# Patient Record
Sex: Female | Born: 1985 | Race: White | Hispanic: No | Marital: Married | Smoking: Light tobacco smoker
Health system: Southern US, Community
[De-identification: ages and names within clinical notes are randomized; demographics above are authoritative.]

## PROBLEM LIST (undated history)

## (undated) DIAGNOSIS — K219 Gastro-esophageal reflux disease without esophagitis: Secondary | ICD-10-CM

## (undated) DIAGNOSIS — F32A Depression, unspecified: Secondary | ICD-10-CM

## (undated) DIAGNOSIS — F329 Major depressive disorder, single episode, unspecified: Secondary | ICD-10-CM

## (undated) DIAGNOSIS — F419 Anxiety disorder, unspecified: Secondary | ICD-10-CM

## (undated) HISTORY — DX: Major depressive disorder, single episode, unspecified: F32.9

## (undated) HISTORY — DX: Depression, unspecified: F32.A

## (undated) HISTORY — PX: TUBAL LIGATION: SHX77

## (undated) HISTORY — PX: CHOLECYSTECTOMY: SHX55

## (undated) HISTORY — DX: Gastro-esophageal reflux disease without esophagitis: K21.9

## (undated) HISTORY — DX: Anxiety disorder, unspecified: F41.9

---

## 2017-11-27 ENCOUNTER — Encounter: Payer: Self-pay | Admitting: Medical

## 2017-11-27 ENCOUNTER — Ambulatory Visit (HOSPITAL_BASED_OUTPATIENT_CLINIC_OR_DEPARTMENT_OTHER)
Admission: RE | Admit: 2017-11-27 | Discharge: 2017-11-27 | Disposition: A | Discharge status: Home / Self Care | Payer: Managed Care, Other (non HMO) | Source: Ambulatory Visit | Attending: Medical | Admitting: Medical

## 2017-11-27 ENCOUNTER — Ambulatory Visit: Payer: Managed Care, Other (non HMO) | Admitting: Medical

## 2017-11-27 VITALS — BP 123/80 | HR 70 | Temp 98.6°F | Resp 16 | Ht 63.5 in | Wt 196.4 lb

## 2017-11-27 DIAGNOSIS — R05 Cough: Secondary | ICD-10-CM | POA: Diagnosis present

## 2017-11-27 DIAGNOSIS — F17200 Nicotine dependence, unspecified, uncomplicated: Secondary | ICD-10-CM

## 2017-11-27 DIAGNOSIS — R0789 Other chest pain: Secondary | ICD-10-CM

## 2017-11-27 DIAGNOSIS — R062 Wheezing: Secondary | ICD-10-CM

## 2017-11-27 DIAGNOSIS — R059 Cough, unspecified: Secondary | ICD-10-CM

## 2017-11-27 DIAGNOSIS — F419 Anxiety disorder, unspecified: Secondary | ICD-10-CM

## 2017-11-27 DIAGNOSIS — F32A Depression, unspecified: Secondary | ICD-10-CM

## 2017-11-27 DIAGNOSIS — F329 Major depressive disorder, single episode, unspecified: Secondary | ICD-10-CM | POA: Diagnosis not present

## 2017-11-27 DIAGNOSIS — M940 Chondrocostal junction syndrome [Tietze]: Secondary | ICD-10-CM

## 2017-11-27 MED ORDER — BUPROPION HCL ER (XL) 150 MG PO TB24: 150.0000 mg | ORAL_TABLET | Freq: Every day | ORAL | 0 refills | Status: DC

## 2017-11-27 MED ORDER — PREDNISONE 10 MG PO TABS: ORAL_TABLET | ORAL | 0 refills | Status: DC

## 2017-11-27 MED ORDER — BUSPIRONE HCL 7.5 MG PO TABS: 7.5000 mg | ORAL_TABLET | Freq: Two times a day (BID) | ORAL | 0 refills | Status: DC

## 2017-11-27 NOTE — Progress Notes (Signed)
Subjective:    Patient ID: Karen Bradley, female    DOB: [birth year], 32 y.o.   MRN: [id number]  HPI  Pt in for first time.   Pt living in area for 4 years. From [place]. Pt exercises on regular basis. She walks her dog 2 miles a day. Pt eats healthy. Smoke 4 cigarettes. 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 7 and 34 yo.   Pt does have anxiety for about 3 months. She states started with panic attack when she had to go to ED. Was diagnosed with anxiety. Pt states she worries of her own health.Pt states this anxiety seemed to start after she missed a lot of warning signs from her sons appendicitis/rupture. Seemed to make her realize potential problems for health. No meds for anxiety. Also admits to mild depression.(No hx of seizures or eating disorder) Pt states mid morning she feels anxious the most.  Pt also mentions recent faint bubble sensation with anxiety sensation(also faint soreness around her chest for one week.)  States faint soreness in rib cage. Also feeling some wheezing intermittently. Pt smoker since 32 yo. Used to be 1/2 pack a day smoker. No hx of asthma when younger. UC gave benzonatate for occasional cough. Pt still has mild wheeze.   No history of heart burn. No obvious belch.  Pt had early menopause. 4 years ago  Review of Systems  Constitutional: Negative for chills, fatigue and fever.  HENT: Negative for congestion, ear discharge and ear pain.   Respiratory: Negative for cough, chest tightness and wheezing.   Cardiovascular: Negative for chest pain and palpitations.       See hpi.  Gastrointestinal: Negative for abdominal pain.  Genitourinary: Negative for flank pain.  Musculoskeletal: Negative for back pain, joint swelling and neck stiffness.       See hpi  Skin: Negative for rash.  Neurological: Negative for dizziness, speech difficulty, weakness, light-headedness and headaches.  Hematological: Negative for adenopathy. Does not bruise/bleed easily.    Psychiatric/Behavioral: Positive for dysphoric mood. Negative for behavioral problems, confusion, sleep disturbance and suicidal ideas. The patient is nervous/anxious.      Past Medical History:  Diagnosis Date  . Anxiety   . Depression   . GERD (gastroesophageal reflux disease)      Social History   Socioeconomic History  . Marital status: Married    Spouse name: Not on file  . Number of children: Not on file  . Years of education: Not on file  . Highest education level: Not on file  Occupational History  . Not on file  Social Needs  . Financial resource strain: Not on file  . Food insecurity:    Worry: Not on file    Inability: Not on file  . Transportation needs:    Medical: Not on file    Non-medical: Not on file  Tobacco Use  . Smoking status: Light Tobacco Smoker  . Smokeless tobacco: Never Used  Substance and Sexual Activity  . Alcohol use: Never    Frequency: Never  . Drug use: Not on file  . Sexual activity: Not on file  Lifestyle  . Physical activity:    Days per week: Not on file    Minutes per session: Not on file  . Stress: Not on file  Relationships  . Social connections:    Talks on phone: Not on file    Gets together: Not on file    Attends religious service: Not on file    Active  member of club or organization: Not on file    Attends meetings of clubs or organizations: Not on file    Relationship status: Not on file  . Intimate partner violence:    Fear of current or ex partner: Not on file    Emotionally abused: Not on file    Physically abused: Not on file    Forced sexual activity: Not on file  Other Topics Concern  . Not on file  Social History Narrative  . Not on file      Family History  Problem Relation Age of Onset  . Depression Mother   . Diabetes Mother   . Hyperlipidemia Mother   . Heart disease Mother   . Diabetes Father   . Heart disease Father   . Hyperlipidemia Father   . Depression Brother   . Diabetes Brother    . Heart disease Brother   . Hyperlipidemia Brother   . Mental illness Brother     Not on File  Current Outpatient Medications on File Prior to Visit  Medication Sig Dispense Refill  . benzonatate (TESSALON) 200 MG capsule TK ONE C PO TID  0  . PROAIR HFA 108 (90 Base) MCG/ACT inhaler INL 2 PFS PO Q 4 TO 6 H PRN  0   No current facility-administered medications on file prior to visit.     BP 123/80   Pulse 70   Temp 98.6 F (37 C) (Oral)   Resp 16   Ht 5' 3.5" (1.613 m)   Wt 196 lb 6.4 oz (89.1 kg)   LMP 11/23/2017   SpO2 98%   BMI 34.24 kg/m       Objective:   Physical Exam  General Mental Status- Alert. General Appearance- Not in acute distress.   Skin General: Color- Normal Color. Moisture- Normal Moisture.  Neck Carotid Arteries- Normal color. Moisture- Normal Moisture. No carotid bruits. No JVD.  Chest and Lung Exam Auscultation: Breath Sounds:-Normal.  Cardiovascular Auscultation:Rythm- Regular. Murmurs & Other Heart Sounds:Auscultation of the heart reveals- No Murmurs.  Abdomen Inspection:-Inspeection Normal. Palpation/Percussion:Note:No mass. Palpation and Percussion of the abdomen reveal- Non Tender, Non Distended + BS, no rebound or guarding.  Neurologic Cranial Nerve exam:- CN III-XII intact(No nystagmus), symmetric smile. Strength:- 5/5 equal and symmetric strength both upper and lower extremities.  Anterior thorax- costochondral junction tender to palpation and left rib tender to palption.      Assessment & Plan:  For anxiety and depression, I am prescribing BuSpar and Wellbutrin.  Recommend BuSpar first and then in 1 week start Wellbutrin.  BuSpar is more for more for anxiety and Wellbutrin for your mood.  Also Wellbutrin help with smoking cessation.  I recommend start to taper off of cigarettes after being on Wellbutrin for 2 weeks.  You do appear to have some costochondritis type symptoms recently.  Physical exam supports  costochondritis diagnosis.  Will prescribe prednisone a brief taper dose to see if this helps.  Also prednisone can help with recent mild wheezing.  Continue albuterol if needed.  I decided best to go ahead and get a baseline EKG to have on hand in the event any recurrent atypical chest pain reoccurs.  Also please get chest x-ray today.  Follow-up in 3 to 4 weeks or as needed.  Mackie Pai, PA-C

## 2017-11-27 NOTE — Patient Instructions (Addendum)
For anxiety and depression, I am prescribing BuSpar and Wellbutrin.  Recommend BuSpar first and then in 1 week start Wellbutrin.  BuSpar is more for more for anxiety and Wellbutrin for your mood.  Also Wellbutrin help with smoking cessation.  I recommend start to taper off of cigarettes after being on Wellbutrin for 2 weeks.  You do appear to have some costochondritis type symptoms recently.  Physical exam supports costochondritis diagnosis.  Will prescribe prednisone a brief taper dose to see if this helps.  Also prednisone can help with recent mild wheezing.  Continue albuterol if needed.  I decided best to go ahead and get a baseline EKG to have on hand in the event any recurrent atypical chest pain reoccurs.(sinus rhythm. Mild bradycardia)  Also please get chest x-ray today.  Follow-up in 3 to 4 weeks or as needed.

## 2017-11-28 ENCOUNTER — Other Ambulatory Visit: Payer: Self-pay

## 2017-11-28 ENCOUNTER — Encounter (HOSPITAL_BASED_OUTPATIENT_CLINIC_OR_DEPARTMENT_OTHER): Payer: Self-pay

## 2017-11-28 ENCOUNTER — Emergency Department (HOSPITAL_BASED_OUTPATIENT_CLINIC_OR_DEPARTMENT_OTHER): Payer: Managed Care, Other (non HMO)

## 2017-11-28 ENCOUNTER — Ambulatory Visit: Payer: Self-pay

## 2017-11-28 ENCOUNTER — Emergency Department (HOSPITAL_BASED_OUTPATIENT_CLINIC_OR_DEPARTMENT_OTHER)
Admission: EM | Admit: 2017-11-28 | Discharge: 2017-11-28 | Disposition: A | Discharge status: Home / Self Care | Payer: Managed Care, Other (non HMO) | Attending: Emergency Medicine | Admitting: Emergency Medicine

## 2017-11-28 DIAGNOSIS — Z79899 Other long term (current) drug therapy: Secondary | ICD-10-CM | POA: Insufficient documentation

## 2017-11-28 DIAGNOSIS — F17200 Nicotine dependence, unspecified, uncomplicated: Secondary | ICD-10-CM | POA: Insufficient documentation

## 2017-11-28 DIAGNOSIS — R079 Chest pain, unspecified: Secondary | ICD-10-CM | POA: Diagnosis present

## 2017-11-28 DIAGNOSIS — D3501 Benign neoplasm of right adrenal gland: Secondary | ICD-10-CM | POA: Insufficient documentation

## 2017-11-28 DIAGNOSIS — R0789 Other chest pain: Secondary | ICD-10-CM | POA: Insufficient documentation

## 2017-11-28 LAB — TROPONIN I: Troponin I: <0.03 ng/mL (ref <0.03)

## 2017-11-28 LAB — CBC
HEMATOCRIT: 47.7 % — AB (ref 36.0–46.0)
Hemoglobin: 16.7 g/dL — ABNORMAL HIGH (ref 12.0–15.0)
MCH: 31.8 pg (ref 26.0–34.0)
MCHC: 35 g/dL (ref 30.0–36.0)
MCV: 90.9 fL (ref 80.0–100.0)
NRBC: 0 % (ref 0.0–0.2)
Platelets: 359 10*3/uL (ref 150–400)
RBC: 5.25 MIL/uL — ABNORMAL HIGH (ref 3.87–5.11)
RDW: 11.6 % (ref 11.5–15.5)
WBC: 26.3 10*3/uL — AB (ref 4.0–10.5)

## 2017-11-28 LAB — BASIC METABOLIC PANEL
Anion gap: 11 (ref 5–15)
BUN: 10 mg/dL (ref 6–20)
CHLORIDE: 102 mmol/L (ref 98–111)
CO2: 24 mmol/L (ref 22–32)
CREATININE: 0.62 mg/dL (ref 0.44–1.00)
Calcium: 9.6 mg/dL (ref 8.9–10.3)
GFR calc non Af Amer: >60 mL/min (ref >60)
Glucose, Bld: 110 mg/dL — ABNORMAL HIGH (ref 70–99)
Potassium: 3.5 mmol/L (ref 3.5–5.1)
SODIUM: 137 mmol/L (ref 135–145)

## 2017-11-28 LAB — PREGNANCY, URINE: PREG TEST UR: NEGATIVE

## 2017-11-28 MED ORDER — IOPAMIDOL (ISOVUE-370) INJECTION 76%
100.0000 mL | Freq: Once | INTRAVENOUS | Status: AC | PRN
  Administered 2017-11-28: 72 mL via INTRAVENOUS

## 2017-11-28 NOTE — ED Triage Notes (Signed)
Pt c/o central CP x today-states she was seen by PCP yesterday for same-dx costochondritis and anxiety-rx prednisone, buspar and to start wellbutrin in  1week-pt anxious/ NAD-steady gait

## 2017-11-28 NOTE — ED Notes (Signed)
ED Provider at bedside. 

## 2017-11-28 NOTE — Discharge Instructions (Addendum)
Discontinue the prednisone. Follow-up with your primary care provider.  Return to ER for worsening or concerning symptoms.

## 2017-11-28 NOTE — ED Provider Notes (Addendum)
Medical screening examination/treatment/procedure(s) were conducted as a shared visit with non-physician practitioner(s) and myself.  I personally evaluated the patient during the encounter.  EKG Interpretation  Date/Time:  Friday November 28 2017 16:25:13 EDT Ventricular Rate:  80 PR Interval:  134 QRS Duration: 82 QT Interval:  344 QTC Calculation: 396 R Axis:   75 Text Interpretation:  Normal sinus rhythm ST & T wave abnormality, consider inferior ischemia No previous tracing Confirmed by Blanchie Dessert [phone]) on 12/03/2017 8:46:18 PM  Patient has had several episodes of recurrent chest pain that have been attributed to anxiety.  Patient reports that she does not feel anxious but when she started getting chest pain she got diagnosed with anxiety.  Pain radiates with associated shortness of breath.  Patient is alert and nontoxic.  Lungs are grossly clear.  Abdomen soft and nontender.  CBC is significantly elevated.  At this time chest pain is atypical but concerning for possible PE with shortness of breath and radiating quality, or other structural/ infectious etiology.  Recommend proceeding with CTA of the chest.  I agree with plan of management.   Charlesetta Shanks, MD 11/28/17 Junious Dresser    Charlesetta Shanks, MD 12/10/17 [id number]    Charlesetta Shanks, MD 12/10/17 [phone]

## 2017-11-28 NOTE — ED Provider Notes (Signed)
[facility] EMERGENCY DEPARTMENT Provider Note   CSN: [id number] Arrival date & time: 11/28/17  1616     History   Chief Complaint Chief Complaint  Patient presents with  . Chest Pain    HPI Karen Bradley is a 32 y.o. female.  32 year old female presents with complaint of pain in her chest.  Started around 11 AM this morning about 30 minutes after taking prednisone and BuSpar today.  Pain is constant, worse with palpation, located at her lower sternum and adjacent ribs.  Reports having her normal anxiety symptoms described as pain in her left and right axillary areas that radiates around to her back, with mild shortness of breath.  Patient was seen at her PCP office yesterday to establish care and seek treatment for anxiety.  Patient was diagnosed with anxiety several months ago, states she has been having daily anxiety for the past 2 weeks, went to PCP who put her on BuSpar and gave her prednisone for diagnosis of costochondritis (costochondritis this is made based on history of pain in her axillary areas with report of wheezing and cough).     Past Medical History:  Diagnosis Date  . Anxiety   . Depression   . GERD (gastroesophageal reflux disease)     There are no active problems to display for this patient.   Past Surgical History:  Procedure Laterality Date  . CHOLECYSTECTOMY    . TUBAL LIGATION       OB History   None      Home Medications    Prior to Admission medications   Medication Sig Start Date End Date Taking? Authorizing Provider  benzonatate (TESSALON) 200 MG capsule TK ONE C PO TID 11/21/17   [provider]  buPROPion (WELLBUTRIN XL) 150 MG 24 hr tablet Take 1 tablet (150 mg total) by mouth daily. 11/27/17   Saguier, Percell Miller, PA-C  busPIRone (BUSPAR) 7.5 MG tablet Take 1 tablet (7.5 mg total) by mouth 2 (two) times daily. 11/27/17   Saguier, Percell Miller, PA-C  predniSONE (DELTASONE) 10 MG tablet 4 tab po day 1, 3 tab po day 2, 2  tab po day 3, 1 tab po day 4 11/27/17   Saguier, Percell Miller, PA-C  PROAIR HFA 108 (90 Base) MCG/ACT inhaler INL 2 PFS PO Q 4 TO 6 H PRN 11/21/17   [provider]    Family History Family History  Problem Relation Age of Onset  . Depression Mother   . Diabetes Mother   . Hyperlipidemia Mother   . Heart disease Mother   . Diabetes Father   . Heart disease Father   . Hyperlipidemia Father   . Depression Brother   . Diabetes Brother   . Heart disease Brother   . Hyperlipidemia Brother   . Mental illness Brother     Social History Social History   Tobacco Use  . Smoking status: Light Tobacco Smoker    Packs/day: 0.25  . Smokeless tobacco: Never Used  Substance Use Topics  . Alcohol use: Never    Frequency: Never  . Drug use: Never     Allergies   Patient has no known allergies.   Review of Systems Review of Systems  Constitutional: Negative for chills and fever.  HENT: Negative for congestion.   Respiratory: Positive for cough, shortness of breath and wheezing. Negative for chest tightness.   Cardiovascular: Positive for chest pain. Negative for palpitations and leg swelling.  Gastrointestinal: Positive for nausea. Negative for abdominal pain, constipation,  diarrhea and vomiting.  Genitourinary: Negative for dysuria and frequency.  Musculoskeletal: Negative for arthralgias and neck pain.  Skin: Negative for rash and wound.  Allergic/Immunologic: Negative for immunocompromised state.  Hematological: Negative for adenopathy. Does not bruise/bleed easily.  Psychiatric/Behavioral: Negative for confusion.  All other systems reviewed and are negative.    Physical Exam Updated Vital Signs BP 119/84 (BP Location: Right Arm)   Pulse 76   Temp 98.2 F (36.8 C) (Oral)   Resp 16   Ht 5\' 3"  (1.6 m)   Wt 89.1 kg   LMP 11/23/2017   SpO2 100%   BMI 34.80 kg/m   Physical Exam  Constitutional: She is oriented to person, place, and time. She appears well-developed  and well-nourished. No distress.  HENT:  Head: Normocephalic and atraumatic.  Cardiovascular: Normal rate, regular rhythm, intact distal pulses and normal pulses.  Pulmonary/Chest: Effort normal and breath sounds normal. She has no wheezes.    Abdominal: Soft. There is no tenderness.  Musculoskeletal:       Right lower leg: She exhibits no tenderness and no edema.       Left lower leg: She exhibits no tenderness and no edema.  Neurological: She is alert and oriented to person, place, and time.  Skin: Skin is warm and dry. She is not diaphoretic.  Psychiatric: She has a normal mood and affect. Her behavior is normal.  Nursing note and vitals reviewed.    ED Treatments / Results  Labs (all labs ordered are listed, but only abnormal results are displayed) Labs Reviewed  BASIC METABOLIC PANEL - Abnormal; Notable for the following components:      Result Value   Glucose, Bld 110 (*)    All other components within normal limits  CBC - Abnormal; Notable for the following components:   WBC 26.3 (*)    RBC 5.25 (*)    Hemoglobin 16.7 (*)    HCT 47.7 (*)    All other components within normal limits  TROPONIN I  PREGNANCY, URINE    EKG None  Radiology Dg Chest 2 View  Result Date: 11/28/2017 CLINICAL DATA:  Chest pain EXAM: CHEST - 2 VIEW COMPARISON:  11/27/2017 FINDINGS: The heart size and mediastinal contours are within normal limits. Both lungs are clear. The visualized skeletal structures are unremarkable. IMPRESSION: Normal chest. Electronically Signed   By: Ulyses Jarred M.D.   On: 11/28/2017 17:16   Dg Chest 2 View  Result Date: 11/27/2017 CLINICAL DATA:  Cough EXAM: CHEST - 2 VIEW COMPARISON:  None. FINDINGS: Lungs are clear. Heart size and pulmonary vascularity are normal. No adenopathy. No pneumothorax. No bone lesions. IMPRESSION: No edema or consolidation. Electronically Signed   By: Lowella Grip III M.D.   On: 11/27/2017 11:03   Ct Angio Chest Pe W/cm &/or Wo  Cm  Result Date: 11/28/2017 CLINICAL DATA:  32 y/o  F; 1 day of shortness of breath. EXAM: CT ANGIOGRAPHY CHEST WITH CONTRAST TECHNIQUE: Multidetector CT imaging of the chest was performed using the standard protocol during bolus administration of intravenous contrast. Multiplanar CT image reconstructions and MIPs were obtained to evaluate the vascular anatomy. CONTRAST:  36mL ISOVUE-370 IOPAMIDOL (ISOVUE-370) INJECTION 76% COMPARISON:  11/28/2017 chest radiograph FINDINGS: Cardiovascular: Satisfactory opacification of the pulmonary arteries to the segmental level. No evidence of pulmonary embolism. Normal heart size. No pericardial effusion. Mediastinum/Nodes: No enlarged mediastinal, hilar, or axillary lymph nodes. Thyroid gland, trachea, and esophagus demonstrate no significant findings. Lungs/Pleura: Lungs are clear. No pleural  effusion or pneumothorax. Upper Abdomen: Cholecystectomy. 15 mm right adrenal nodule measuring 3 HU compatible with adenoma. Musculoskeletal: No chest wall abnormality. No acute or significant osseous findings. Review of the MIP images confirms the above findings. IMPRESSION: 1. No pulmonary embolus identified.  Clear lungs. 2. 15 mm right adrenal adenoma. Electronically Signed   By: Kristine Garbe M.D.   On: 11/28/2017 19:29    Procedures Procedures (including critical care time)  Medications Ordered in ED Medications  iopamidol (ISOVUE-370) 76 % injection 100 mL (72 mLs Intravenous Contrast Given 11/28/17 1904)     Initial Impression / Assessment and Plan / ED Course  I have reviewed the triage vital signs and the nursing notes.  Pertinent labs & imaging results that were available during my care of the patient were reviewed by me and considered in my medical decision making (see chart for details).  Clinical Course as of Nov 29 1939  Fri Oct 25, 935  [id number] 32 year old female presents with complaint of chest pain today after taking her medication.   Patient states that she was diagnosed with anxiety 2 months ago, followed up with PCP yesterday for worsening anxiety symptoms for the past 2 weeks.  Patient describes her anxiety as pain in her mid axillary lower ribs which radiates into her back and causes her to feel nauseous with shortness of breath.  Chest x-ray is normal, repeat vital signs within normal limits, CBC with elevated white count 26,000.  Troponin x1-, BMP unremarkable, urine pregnancy test is negative.  Case discussed with Dr. Vallery Ridge, ER attending, has seen the patient and recommends CTA chest to evaluate for PE.   [LM]  1904 Personal family history of PE, patient is a daily smoker, does not take hormonal placement therapy, no recent extended travel.   [LM]  1940 CTA is negative for PE, incidental finding of right adrenal adenoma.  Findings discussed with patient, recommend PCP follow-up return to ER for worsening or concerning symptoms.  Recommend that she discontinue the prednisone.   [LM]    Clinical Course User Index [LM] Tacy Learn, PA-C   Final Clinical Impressions(s) / ED Diagnoses   Final diagnoses:  Atypical chest pain  Adrenal adenoma, right    ED Discharge Orders    None       Tacy Learn, PA-C 11/28/17 1941    Charlesetta Shanks, MD 12/10/17 1610

## 2017-11-28 NOTE — ED Notes (Signed)
Ambulated to RR unassisted

## 2017-11-28 NOTE — Telephone Encounter (Signed)
Returned call to pt.  Reported she had onset of "a burning sensation" just below her sternum, after awakening today.  Reported the pain has been constant the past 2 hrs.  Rated the pain at 7/10.  Stated there is a radiation of the pain from the base of the sternum to her throat. Reported she tried Tums, without any relief.  Reported feels the need to burp intermittently.  Seen in office yesterday, and started Prednisone and Buspar.  Reported took Prednisone 4 tabs 10/24, and 2 tabs today.  Reported took Buspar 2 tabs 10/24, and 1 tab today.  Has only eaten a dry piece of toast today.  Denied having anything like this before.  Reported she has not smoked a cigarette today.  Denied nausea/ vomiting, or dizziness.  Stated she has intermittent shortness of breath because her "throat burns."    Called FC.  Reported pt's. complaints.  Advised to send Triage note to the office for provider to review and make further recommendations.    Pt. advised of plan and to await call from PCP office for recommendations.  Advised if has worsening chest pain or shortness of breath, to go to ER or call 911.  Verb. Understanding and agreed.         Reason for Disposition . [1] MILD-MODERATE pain AND [2] constant AND [3] present > 2 hours    Seen in the office yesterday for anxiety and panic attacks.  Diag. with Costrochondritis and started on Prednisone and Buspar.  Call placed to PCP office to report c/o severe burning from base of sternum to the throat.  Hx of smoking.  Answer Assessment - Initial Assessment Questions 1. LOCATION: "Where does it hurt?"       Just below the sternum  2. RADIATION: "Does the pain go anywhere else?" (e.g., into neck, jaw, arms, back)     Radiates up to chest to the throat like it is burning 3. ONSET: "When did the chest pain begin?" (Minutes, hours or days)      When she was was up for a little while  4. PATTERN "Does the pain come and go, or has it been constant since it started?"   "Does it get worse with exertion?"      Constant x 2 hrs.  5. DURATION: "How long does it last" (e.g., seconds, minutes, hours)     Ongoing  6. SEVERITY: "How bad is the pain?"  (e.g., Scale 1-10; mild, moderate, or severe)    - MILD (1-3): doesn't interfere with normal activities     - MODERATE (4-7): interferes with normal activities or awakens from sleep    - SEVERE (8-10): excruciating pain, unable to do any normal activities       7/10 7. CARDIAC RISK FACTORS: "Do you have any history of heart problems or risk factors for heart disease?" (e.g., prior heart attack, angina; high blood pressure, diabetes, being overweight, high cholesterol, smoking, or strong family history of heart disease)     Denied HBP, elev. Cholesterol, or DM; trying to quit smoking; has not had a cigarette today; is not currently taking any thing for smoking cessation 8. PULMONARY RISK FACTORS: "Do you have any history of lung disease?"  (e.g., blood clots in lung, asthma, emphysema, birth control pills)    Did not ask 9. CAUSE: "What do you think is causing the chest pain?"    Unknown; hx of anxiety 10. OTHER SYMPTOMS: "Do you have any other symptoms?" (e.g., dizziness, nausea,  vomiting, sweating, fever, difficulty breathing, cough)       Heartburn,  Just "burns",  Pain is constant from upper stomach to the throat, intermittent shortness of breath, denied dizziness  11. PREGNANCY: "Is there any chance you are pregnant?" "When was your last menstrual period?"       LMP ; ending today  Answer Assessment - Initial Assessment Questions 1. LOCATION: "Where does it hurt?"      At base of sternum 2. RADIATION: "Does the pain shoot anywhere else?" (e.g., chest, back)     From base of sternum to the throat; feels like throat is burning; feels need to burp 3. ONSET: "When did the pain begin?" (e.g., minutes, hours or days ago)      Today, a little while after getting up; has been constant past 2 hrs.  4. SUDDEN: "Gradual  or sudden onset?"     gradual 5. PATTERN "Does the pain come and go, or is it constant?"    - If constant: "Is it getting better, staying the same, or worsening?"      (Note: Constant means the pain never goes away completely; most serious pain is constant and it progresses)     - If intermittent: "How long does it last?" "Do you have pain now?"     (Note: Intermittent means the pain goes away completely between bouts)     Constant past 2 hrs. 6. SEVERITY: "How bad is the pain?"  (e.g., Scale 1-10; mild, moderate, or severe)    - MILD (1-3): doesn't interfere with normal activities, abdomen soft and not tender to touch     - MODERATE (4-7): interferes with normal activities or awakens from sleep, tender to touch     - SEVERE (8-10): excruciating pain, doubled over, unable to do any normal activities       7/10; describes like a burning sensation  7. RECURRENT SYMPTOM: "Have you ever had this type of abdominal pain before?" If so, ask: "When was the last time?" and "What happened that time?"      no 8. AGGRAVATING FACTORS: "Does anything seem to cause this pain?" (e.g., foods, stress, alcohol)     Has anxiety 9. CARDIAC SYMPTOMS: "Do you have any of the following symptoms: chest pain, difficulty breathing, sweating, nausea?"     Denied nausea/ vomiting.  Reported intermittent shortness of breath with throat burning  10. OTHER SYMPTOMS: "Do you have any other symptoms?" (e.g., fever, vomiting, diarrhea)       Denied dizziness 11. PREGNANCY: "Is there any chance you are pregnant?" "When was your last menstrual period?"       LMP; finishing this today.  Protocols used: ABDOMINAL PAIN - UPPER-A-AH, CHEST PAIN-A-AH Message from Gardiner Ramus sent at 11/28/2017 2:04 PM EDT   Summary: heartburn    Pt called and stated that she is experiencing server heartburn. Pt would like to know what she could take that would not affect current medications.

## 2017-11-30 NOTE — Telephone Encounter (Signed)
Please ask patient to follow up from the emergency department visit. This is needed post ED visit rather than phone call.

## 2017-12-01 NOTE — Telephone Encounter (Signed)
Please call and schedule pt follow up.

## 2017-12-01 NOTE — Telephone Encounter (Signed)
Pt scheduled appt for ED this Thursday at 10:00 am. Done

## 2017-12-04 ENCOUNTER — Encounter: Payer: Self-pay | Admitting: Medical

## 2017-12-04 ENCOUNTER — Ambulatory Visit: Payer: Managed Care, Other (non HMO) | Admitting: Medical

## 2017-12-04 VITALS — BP 121/81 | HR 77 | Temp 98.9°F | Resp 16 | Ht 64.0 in | Wt 196.6 lb

## 2017-12-04 DIAGNOSIS — F419 Anxiety disorder, unspecified: Secondary | ICD-10-CM

## 2017-12-04 DIAGNOSIS — R062 Wheezing: Secondary | ICD-10-CM

## 2017-12-04 DIAGNOSIS — F32A Depression, unspecified: Secondary | ICD-10-CM

## 2017-12-04 DIAGNOSIS — D3501 Benign neoplasm of right adrenal gland: Secondary | ICD-10-CM

## 2017-12-04 DIAGNOSIS — F329 Major depressive disorder, single episode, unspecified: Secondary | ICD-10-CM

## 2017-12-04 DIAGNOSIS — M940 Chondrocostal junction syndrome [Tietze]: Secondary | ICD-10-CM | POA: Diagnosis not present

## 2017-12-04 DIAGNOSIS — D3500 Benign neoplasm of unspecified adrenal gland: Secondary | ICD-10-CM

## 2017-12-04 DIAGNOSIS — D72829 Elevated white blood cell count, unspecified: Secondary | ICD-10-CM

## 2017-12-04 MED ORDER — FLUTICASONE PROPIONATE HFA 110 MCG/ACT IN AERO: 2.0000 | INHALATION_SPRAY | Freq: Two times a day (BID) | RESPIRATORY_TRACT | 0 refills | Status: DC

## 2017-12-04 MED ORDER — CLONAZEPAM 0.5 MG PO TABS: 0.5000 mg | ORAL_TABLET | Freq: Two times a day (BID) | ORAL | 0 refills | Status: DC | PRN

## 2017-12-04 NOTE — Progress Notes (Signed)
Subjective:    Patient ID: Karen Bradley, female    DOB: 21-Sep-1985, 32 y.o.   MRN: [id number]  HPI   Pt in for follow up.  She still feels bubble in mid chest and has some possible faint wheeze. Pt went to ED and had extensive work up. Pt troponin and ekg were negative. Pt ct of chest showed no PE.  After negative work up ED attributed all symptoms to anxiety.   Pt explains mid chest and describes cartlidge junction tender to palpation.  Pt mentions on prednisone in past had no reaction before. But she thinks with buspar it made her more anxious.  She has been on buspar after ED visit but no side effects However it is not helping her anxiety. Pt is willing to use clonzepam in light of no response to buspar.   Pt did have high wbc count. She states it was always been high. Even as child. Pt took one day of prednisone before she went to the ED.   Pt is in early menopause. She has not been running fever but does get some hot flashes.    Review of Systems  Constitutional: Negative for chills, fatigue and fever.  Respiratory: Negative for cough, choking and wheezing.   Cardiovascular: Negative for chest pain and palpitations.       Non cardiac chest pain  Gastrointestinal: Negative for abdominal pain.  Musculoskeletal: Negative for back pain.       See hpi.  Skin: Negative for rash.  Neurological: Negative for dizziness, syncope, weakness and headaches.  Hematological: Negative for adenopathy. Does not bruise/bleed easily.  Psychiatric/Behavioral: Positive for dysphoric mood. Negative for agitation, behavioral problems, sleep disturbance and suicidal ideas. The patient is nervous/anxious.     Past Medical History:  Diagnosis Date  . Anxiety   . Depression   . GERD (gastroesophageal reflux disease)      Social History   Socioeconomic History  . Marital status: Married    Spouse name: Not on file  . Number of children: Not on file  . Years of education: Not on file  .  Highest education level: Not on file  Occupational History  . Not on file  Social Needs  . Financial resource strain: Not on file  . Food insecurity:    Worry: Not on file    Inability: Not on file  . Transportation needs:    Medical: Not on file    Non-medical: Not on file  Tobacco Use  . Smoking status: Light Tobacco Smoker    Packs/day: 0.25  . Smokeless tobacco: Never Used  Substance and Sexual Activity  . Alcohol use: Never    Frequency: Never  . Drug use: Never  . Sexual activity: Yes  Lifestyle  . Physical activity:    Days per week: Not on file    Minutes per session: Not on file  . Stress: Not on file  Relationships  . Social connections:    Talks on phone: Not on file    Gets together: Not on file    Attends religious service: Not on file    Active member of club or organization: Not on file    Attends meetings of clubs or organizations: Not on file    Relationship status: Not on file  . Intimate partner violence:    Fear of current or ex partner: Not on file    Emotionally abused: Not on file    Physically abused: Not on file  Forced sexual activity: Not on file  Other Topics Concern  . Not on file  Social History Narrative  . Not on file    Past Surgical History:  Procedure Laterality Date  . CHOLECYSTECTOMY    . TUBAL LIGATION      Family History  Problem Relation Age of Onset  . Depression Mother   . Diabetes Mother   . Hyperlipidemia Mother   . Heart disease Mother   . Diabetes Father   . Heart disease Father   . Hyperlipidemia Father   . Depression Brother   . Diabetes Brother   . Heart disease Brother   . Hyperlipidemia Brother   . Mental illness Brother     No Known Allergies  Current Outpatient Medications on File Prior to Visit  Medication Sig Dispense Refill  . benzonatate (TESSALON) 200 MG capsule TK ONE C PO TID  0  . buPROPion (WELLBUTRIN XL) 150 MG 24 hr tablet Take 1 tablet (150 mg total) by mouth daily. 30 tablet 0    . busPIRone (BUSPAR) 7.5 MG tablet Take 1 tablet (7.5 mg total) by mouth 2 (two) times daily. 60 tablet 0  . predniSONE (DELTASONE) 10 MG tablet 4 tab po day 1, 3 tab po day 2, 2 tab po day 3, 1 tab po day 4 10 tablet 0  . PROAIR HFA 108 (90 Base) MCG/ACT inhaler INL 2 PFS PO Q 4 TO 6 H PRN  0   No current facility-administered medications on file prior to visit.     BP 121/81   Pulse 77   Temp 98.9 F (37.2 C) (Oral)   Resp 16   Ht 5\' 4"  (1.626 m)   Wt 196 lb 9.6 oz (89.2 kg)   LMP 11/23/2017   SpO2 98%   BMI 33.75 kg/m       Objective:   Physical Exam  General Mental Status- Alert. General Appearance- Not in acute distress.   Skin General: Color- Normal Color. Moisture- Normal Moisture.  Neck Carotid Arteries- Normal color. Moisture- Normal Moisture. No carotid bruits. No JVD.  Chest and Lung Exam Auscultation: Breath Sounds:-Normal.  Cardiovascular Auscultation:Rythm- Regular. Murmurs & Other Heart Sounds:Auscultation of the heart reveals- No Murmurs.  Abdomen Inspection:-Inspeection Normal. Palpation/Percussion:Note:No mass. Palpation and Percussion of the abdomen reveal- Non Tender, Non Distended + BS, no rebound or guarding.   Neurologic Cranial Nerve exam:- CN III-XII intact(No nystagmus), symmetric smile. Strength:- 5/5 equal and symmetric strength both upper and lower extremities.  Anterior chest- mid chest and costochondral junction tender to palpation.    Assessment & Plan:  You had recent severe anxiety/almost panic attack event and were worked up in the emergency department for atypical chest pain.  Work-up for cardiac cause and pulmonary embolism were both negative.  Unfortunately you have not had decrease in anxiety with BuSpar.  So I did go ahead and send in clonazepam.  Rx advisement given and explained to use clonazepam sparingly.  You may need to put you on controlled medication contract in the near future if clonazepam helps and we determined  that you needed on a monthly basis.  For smoking cessation and mild depression, start Wellbutrin tomorrow.  You do appear to have some costochondritis on exam today.  I would recommend low-dose ibuprofen and I stop the prednisone.  For minimal intermittent wheezing, you have albuterol to use as needed.  If using albuterol on a repetitive every 6 hour basis then add on Flovent.  You have a  history of leukocytosis/increased WBCs.  This might be worsened by your smoking and recent prednisone use.  When you are in for follow-up will repeat WBC.  Depending on the level of might refer you to Dr. Marin Olp hematologist for further evaluation.  Incidental adrenal adenoma found on CT imaging done today.  Probably benign but will refer to endocrinologist for further work-up/evaluation.  Follow-up in 2 to 3 weeks or as needed.  Mackie Pai, PA-C

## 2017-12-04 NOTE — Patient Instructions (Signed)
You had recent severe anxiety/almost panic attack event and were worked up in the emergency department for atypical chest pain.  Work-up for cardiac cause and pulmonary embolism were both negative.  Unfortunately you have not had decrease in anxiety with BuSpar.  So I did go ahead and send in clonazepam.  Rx advisement given and explained to use clonazepam sparingly.  You may need to put you on controlled medication contract in the near future if clonazepam helps and we determined that you needed on a monthly basis.  For smoking cessation and mild depression, start Wellbutrin tomorrow.  You do appear to have some costochondritis on exam today.  I would recommend low-dose ibuprofen and I stop the prednisone.  For minimal intermittent wheezing, you have albuterol to use as needed.  If using albuterol on a repetitive every 6 hour basis then add on Flovent.  You have a history of leukocytosis/increased WBCs.  This might be worsened by your smoking and recent prednisone use.  When you are in for follow-up will repeat WBC.  Depending on the level of might refer you to Dr. Marin Olp hematologist for further evaluation.  Incidental adrenal adenoma found on CT imaging done today.  Probably benign but will refer to endocrinologist for further work-up/evaluation.  Follow-up in 2 to 3 weeks or as needed.

## 2017-12-23 ENCOUNTER — Encounter: Payer: Self-pay | Admitting: Medical

## 2017-12-23 ENCOUNTER — Ambulatory Visit: Payer: Managed Care, Other (non HMO) | Admitting: Medical

## 2017-12-23 VITALS — BP 109/73 | HR 66 | Temp 98.1°F | Resp 16 | Ht 64.0 in | Wt 202.0 lb

## 2017-12-23 DIAGNOSIS — F17200 Nicotine dependence, unspecified, uncomplicated: Secondary | ICD-10-CM

## 2017-12-23 DIAGNOSIS — D72829 Elevated white blood cell count, unspecified: Secondary | ICD-10-CM | POA: Diagnosis not present

## 2017-12-23 DIAGNOSIS — F419 Anxiety disorder, unspecified: Secondary | ICD-10-CM

## 2017-12-23 DIAGNOSIS — Z79899 Other long term (current) drug therapy: Secondary | ICD-10-CM

## 2017-12-23 DIAGNOSIS — F329 Major depressive disorder, single episode, unspecified: Secondary | ICD-10-CM

## 2017-12-23 DIAGNOSIS — F32A Depression, unspecified: Secondary | ICD-10-CM

## 2017-12-23 LAB — CBC WITH DIFFERENTIAL/PLATELET
BASOS ABS: 0 10*3/uL (ref 0.0–0.1)
Basophils Relative: 0.5 % (ref 0.0–3.0)
Eosinophils Absolute: 0.1 10*3/uL (ref 0.0–0.7)
Eosinophils Relative: 1 % (ref 0.0–5.0)
HCT: 44.5 % (ref 36.0–46.0)
Hemoglobin: 15.4 g/dL — ABNORMAL HIGH (ref 12.0–15.0)
LYMPHS ABS: 3.3 10*3/uL (ref 0.7–4.0)
Lymphocytes Relative: 31.6 % (ref 12.0–46.0)
MCHC: 34.6 g/dL (ref 30.0–36.0)
MCV: 93.9 fl (ref 78.0–100.0)
MONO ABS: 0.6 10*3/uL (ref 0.1–1.0)
Monocytes Relative: 6 % (ref 3.0–12.0)
Neutro Abs: 6.3 10*3/uL (ref 1.4–7.7)
Neutrophils Relative %: 60.9 % (ref 43.0–77.0)
Platelets: 326 10*3/uL (ref 150.0–400.0)
RBC: 4.73 Mil/uL (ref 3.87–5.11)
RDW: 12.5 % (ref 11.5–15.5)
WBC: 10.4 10*3/uL (ref 4.0–10.5)

## 2017-12-23 MED ORDER — BUPROPION HCL ER (XL) 150 MG PO TB24: 150.0000 mg | ORAL_TABLET | Freq: Every day | ORAL | 3 refills | Status: DC

## 2017-12-23 MED ORDER — CLONAZEPAM 0.5 MG PO TABS: ORAL_TABLET | ORAL | 0 refills | Status: DC

## 2017-12-23 NOTE — Progress Notes (Signed)
Subjective:    Patient ID: Karen Bradley, female    DOB: [birth year], 32 y.o.   MRN: [id number]  HPI  Pt in states she is feeling better. She states she has less anxiety than before. She states can function with 1/2 tab of clonazepam in am and one in pm. This way not overly sedated   Pt also on wellbutrin. She states taking it for 2 weeks. She has been tapered down off of cigarretes. She states seems to help her mood as well.  lmp- started on Saturday morning.   Review of Systems  Constitutional: Negative for chills, fatigue and fever.  Respiratory: Negative for chest tightness and wheezing.   Cardiovascular: Negative for chest pain and palpitations.  Gastrointestinal: Negative for abdominal pain.  Musculoskeletal: Negative for back pain, gait problem, neck pain and neck stiffness.  Skin: Negative for rash.  Neurological: Negative for dizziness, numbness and headaches.  Hematological: Negative for adenopathy. Does not bruise/bleed easily.  Psychiatric/Behavioral: Negative for behavioral problems, confusion, dysphoric mood, self-injury and suicidal ideas. The patient is nervous/anxious.     Past Medical History:  Diagnosis Date  . Anxiety   . Depression   . GERD (gastroesophageal reflux disease)      Social History   Socioeconomic History  . Marital status: Married    Spouse name: Not on file  . Number of children: Not on file  . Years of education: Not on file  . Highest education level: Not on file  Occupational History  . Not on file  Social Needs  . Financial resource strain: Not on file  . Food insecurity:    Worry: Not on file    Inability: Not on file  . Transportation needs:    Medical: Not on file    Non-medical: Not on file  Tobacco Use  . Smoking status: Light Tobacco Smoker    Packs/day: 0.25  . Smokeless tobacco: Never Used  Substance and Sexual Activity  . Alcohol use: Never    Frequency: Never  . Drug use: Never  . Sexual activity: Yes    Lifestyle  . Physical activity:    Days per week: Not on file    Minutes per session: Not on file  . Stress: Not on file  Relationships  . Social connections:    Talks on phone: Not on file    Gets together: Not on file    Attends religious service: Not on file    Active member of club or organization: Not on file    Attends meetings of clubs or organizations: Not on file    Relationship status: Not on file  . Intimate partner violence:    Fear of current or ex partner: Not on file    Emotionally abused: Not on file    Physically abused: Not on file    Forced sexual activity: Not on file  Other Topics Concern  . Not on file  Social History Narrative  . Not on file    Past Surgical History:  Procedure Laterality Date  . CHOLECYSTECTOMY    . TUBAL LIGATION      Family History  Problem Relation Age of Onset  . Depression Mother   . Diabetes Mother   . Hyperlipidemia Mother   . Heart disease Mother   . Diabetes Father   . Heart disease Father   . Hyperlipidemia Father   . Depression Brother   . Diabetes Brother   . Heart disease Brother   . Hyperlipidemia  Brother   . Mental illness Brother     No Known Allergies  Current Outpatient Medications on File Prior to Visit  Medication Sig Dispense Refill  . buPROPion (WELLBUTRIN XL) 150 MG 24 hr tablet Take 1 tablet (150 mg total) by mouth daily. 30 tablet 0  . clonazePAM (KLONOPIN) 0.5 MG tablet Take 1 tablet (0.5 mg total) by mouth 2 (two) times daily as needed for anxiety. 30 tablet 0  . PROAIR HFA 108 (90 Base) MCG/ACT inhaler INL 2 PFS PO Q 4 TO 6 H PRN  0   No current facility-administered medications on file prior to visit.     BP 109/73   Pulse 66   Temp 98.1 F (36.7 C) (Oral)   Resp 16   Ht 5\' 4"  (1.626 m)   Wt 202 lb (91.6 kg)   LMP 11/23/2017   SpO2 99%   BMI 34.67 kg/m       Objective:   Physical Exam  General Mental Status- Alert. General Appearance- Not in acute distress.    Skin General: Color- Normal Color. Moisture- Normal Moisture.  Neck Carotid Arteries- Normal color. Moisture- Normal Moisture. No carotid bruits. No JVD.  Chest and Lung Exam Auscultation: Breath Sounds:-Normal.  Cardiovascular Auscultation:Rythm- Regular. Murmurs & Other Heart Sounds:Auscultation of the heart reveals- No Murmurs.   Neurologic Cranial Nerve exam:- CN III-XII intact(No nystagmus), symmetric smile. Strength:- 5/5 equal and symmetric strength both upper and lower extremities.  .      Assessment & Plan:  Your anxiety and mood are much improved. I am prescribing further clonazepam and wellbutrin.  You will sign controlled med contract and give uds today.  Continue to taper of smoking.  Will repeat wbc/lab today. If lab/wbc continues to be high will refer to hematologist.  Follow up 3-4 months or as needed  [employer], [employer]

## 2017-12-23 NOTE — Patient Instructions (Signed)
Your anxiety and mood are much improved. I am prescribing further clonazepam and wellbutrin.  You will sign controlled med contract and give uds today.  Continue to taper of smoking.  Will repeat wbc/lab today. If lab/wbc continues to be high will refer to hematologist.  Follow up 3-4 months or as needed

## 2017-12-26 LAB — PAIN MGMT, PROFILE 8 W/CONF, U
6 Acetylmorphine: NEGATIVE ng/mL (ref <10)
ALCOHOL METABOLITES: NEGATIVE ng/mL (ref <500)
Amphetamines: NEGATIVE ng/mL (ref <500)
BENZODIAZEPINES: NEGATIVE ng/mL (ref <100)
BUPRENORPHINE, URINE: NEGATIVE ng/mL (ref <5)
COCAINE METABOLITE: NEGATIVE ng/mL (ref <150)
Creatinine: 31.4 mg/dL
MARIJUANA METABOLITE: 314 ng/mL — AB (ref <5)
MDMA: NEGATIVE ng/mL (ref <500)
Marijuana Metabolite: POSITIVE ng/mL — AB (ref <20)
Noroxycodone: NEGATIVE ng/mL (ref <50)
OXIDANT: NEGATIVE ug/mL (ref <200)
OXYCODONE: NEGATIVE ng/mL (ref <100)
OXYMORPHONE: NEGATIVE ng/mL (ref <50)
Opiates: NEGATIVE ng/mL (ref <100)
Oxycodone: NEGATIVE ng/mL (ref <50)
pH: 6.77 (ref 4.5–9.0)

## 2017-12-29 ENCOUNTER — Telehealth: Payer: Self-pay | Admitting: Medical

## 2017-12-29 NOTE — Telephone Encounter (Signed)
Copied from [id number]. Topic: General - Other >> Dec 29, 2017  2:57 PM Yvette Rack wrote: Reason for CRM: Pt returned call for lab results. Pt requests call back. Cb# [phone]

## 2017-12-29 NOTE — Telephone Encounter (Signed)
Copied from [id number]. Topic: Quick Communication - Lab Results (Clinic Use ONLY) >> Dec 26, 2017 10:28 AM Hinton Dyer, [place] wrote: Called patient to inform them of 12/24/17 lab results. When patient returns call, triage nurse may disclose results. >> Dec 26, 2017 10:37 AM Judyann Munson wrote: Patient is returning call for lab result.  >> Dec 29, 2017  9:13 AM Marin Olp L wrote: Patient never got urinalysis results from [facility] Triage

## 2017-12-30 ENCOUNTER — Telehealth: Payer: Self-pay | Admitting: Medical

## 2017-12-30 ENCOUNTER — Ambulatory Visit: Payer: Self-pay

## 2017-12-30 MED ORDER — NITROFURANTOIN MONOHYD MACRO 100 MG PO CAPS: 100.0000 mg | ORAL_CAPSULE | Freq: Two times a day (BID) | ORAL | 0 refills | Status: DC

## 2017-12-30 NOTE — Telephone Encounter (Signed)
Pt.notified

## 2017-12-30 NOTE — Telephone Encounter (Signed)
Charted in result notes. 

## 2017-12-30 NOTE — Telephone Encounter (Signed)
Regarding pt uti complaints. Will send in macroibd for her. Explain this is rare for me to do. If symptoms persist then will need to be seen. Antibiotic will likely interfere with future culture. Only giving due to short week and she is symptomatic.

## 2017-12-30 NOTE — Telephone Encounter (Signed)
Patient called to give lab results and she mentioned that she hasn't received a call about her urinalysis results. I advised no urinalysis was done. I asked about symptoms, she says "I have pain and burning with urination, hazy urine. This started 2 days ago. The pain is not with every time, but sometimes." I asked about fever and other symptoms, she says "no fever. I have frequency and the feeling I still have to urinate, even though I'm done." According to protocol, see PCP within 24 hours. She says "I can't come into the office. 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 Will you just ask if he can call me in something?" I advised I will send this over to [facility] and someone from the office will call with his recommendation, she verbalized understanding.   Reason for Disposition . Urinating more frequently than usual (i.e., frequency)  Answer Assessment - Initial Assessment Questions 1. SEVERITY: "How bad is the pain?"  (e.g., Scale 1-10; mild, moderate, or severe)   - MILD (1-3): complains slightly about urination hurting   - MODERATE (4-7): interferes with normal activities     - SEVERE (8-10): excruciating, unwilling or unable to urinate because of the pain      1-2 2. FREQUENCY: "How many times have you had painful urination today?"      Once when I went 3. PATTERN: "Is pain present every time you urinate or just sometimes?"      Not every time, just sometimes 4. ONSET: "When did the painful urination start?"      2 days ago 5. FEVER: "Do you have a fever?" If so, ask: "What is your temperature, how was it measured, and when did it start?"     No 6. PAST UTI: "Have you had a urine infection before?" If so, ask: "When was the last time?" and "What happened that time?"      Yes; burning with a little pain, constant feeling to urinate; prescribed antibiotic; last time was 3-4 years ago maybe 7. CAUSE: "What do you think is causing the painful urination?"  (e.g., UTI,  scratch, Herpes sore)     UTI 8. OTHER SYMPTOMS: "Do you have any other symptoms?" (e.g., flank pain, vaginal discharge, genital sores, urgency, blood in urine)     Frequency to urinate, then the burning sensation when pee 9. PREGNANCY: "Is there any chance you are pregnant?" "When was your last menstrual period?"     No, tubal ligation in 2012  Answer Assessment - Initial Assessment Questions 1. SYMPTOM: "What's the main symptom you're concerned about?" (e.g., frequency, incontinence)     Frequency, pain, burning with urination 2. ONSET: "When did the symptoms start?"     2 days ago 3. PAIN: "Is there any pain?" If so, ask: "How bad is it?" (Scale: 1-10; mild, moderate, severe)     Mild pain and not every time I urinate 4. CAUSE: "What do you think is causing the symptoms?"     UTI 5. OTHER SYMPTOMS: "Do you have any other symptoms?" (e.g., fever, flank pain, blood in urine, pain with urination)     Pain with urination, frequency, the feeling I'm not done, burning 6. PREGNANCY: "Is there any chance you are pregnant?" "When was your last menstrual period?"     No; tubal ligation in 2012  Protocols used: URINARY SYMPTOMS-A-AH, URINATION PAIN - FEMALE-A-AH

## 2018-01-14 ENCOUNTER — Emergency Department (HOSPITAL_BASED_OUTPATIENT_CLINIC_OR_DEPARTMENT_OTHER)
Admission: EM | Admit: 2018-01-14 | Discharge: 2018-01-14 | Disposition: A | Discharge status: Home / Self Care | Payer: Managed Care, Other (non HMO) | Attending: Emergency Medicine | Admitting: Emergency Medicine

## 2018-01-14 ENCOUNTER — Emergency Department (HOSPITAL_BASED_OUTPATIENT_CLINIC_OR_DEPARTMENT_OTHER): Payer: Managed Care, Other (non HMO)

## 2018-01-14 ENCOUNTER — Other Ambulatory Visit: Payer: Self-pay

## 2018-01-14 ENCOUNTER — Encounter (HOSPITAL_BASED_OUTPATIENT_CLINIC_OR_DEPARTMENT_OTHER): Payer: Self-pay | Admitting: Emergency Medicine

## 2018-01-14 DIAGNOSIS — R1031 Right lower quadrant pain: Secondary | ICD-10-CM | POA: Diagnosis present

## 2018-01-14 DIAGNOSIS — Z720 Tobacco use: Secondary | ICD-10-CM | POA: Insufficient documentation

## 2018-01-14 DIAGNOSIS — Z79899 Other long term (current) drug therapy: Secondary | ICD-10-CM | POA: Diagnosis not present

## 2018-01-14 DIAGNOSIS — R197 Diarrhea, unspecified: Secondary | ICD-10-CM | POA: Diagnosis not present

## 2018-01-14 LAB — COMPREHENSIVE METABOLIC PANEL
ALBUMIN: 4.1 g/dL (ref 3.5–5.0)
ALT: 18 U/L (ref 0–44)
ANION GAP: 8 (ref 5–15)
AST: 18 U/L (ref 15–41)
Alkaline Phosphatase: 76 U/L (ref 38–126)
BILIRUBIN TOTAL: 0.6 mg/dL (ref 0.3–1.2)
BUN: 8 mg/dL (ref 6–20)
CALCIUM: 8.8 mg/dL — AB (ref 8.9–10.3)
CHLORIDE: 107 mmol/L (ref 98–111)
CO2: 22 mmol/L (ref 22–32)
CREATININE: 0.63 mg/dL (ref 0.44–1.00)
GFR calc non Af Amer: >60 mL/min (ref >60)
Glucose, Bld: 111 mg/dL — ABNORMAL HIGH (ref 70–99)
Potassium: 3.7 mmol/L (ref 3.5–5.1)
Sodium: 137 mmol/L (ref 135–145)
Total Protein: 7.2 g/dL (ref 6.5–8.1)

## 2018-01-14 LAB — CBC
HCT: 46.4 % — ABNORMAL HIGH (ref 36.0–46.0)
Hemoglobin: 15.5 g/dL — ABNORMAL HIGH (ref 12.0–15.0)
MCH: 30.8 pg (ref 26.0–34.0)
MCHC: 33.4 g/dL (ref 30.0–36.0)
MCV: 92.2 fL (ref 80.0–100.0)
Platelets: 317 K/uL (ref 150–400)
RBC: 5.03 MIL/uL (ref 3.87–5.11)
RDW: 12 % (ref 11.5–15.5)
WBC: 10.1 K/uL (ref 4.0–10.5)
nRBC: 0 % (ref 0.0–0.2)

## 2018-01-14 LAB — PREGNANCY, URINE: Preg Test, Ur: NEGATIVE

## 2018-01-14 LAB — URINALYSIS, ROUTINE W REFLEX MICROSCOPIC
Bilirubin Urine: NEGATIVE
Glucose, UA: NEGATIVE mg/dL
Hgb urine dipstick: NEGATIVE
Ketones, ur: NEGATIVE mg/dL
Leukocytes, UA: NEGATIVE
Nitrite: NEGATIVE
Protein, ur: NEGATIVE mg/dL
Specific Gravity, Urine: 1.015 (ref 1.005–1.030)
pH: 6 (ref 5.0–8.0)

## 2018-01-14 LAB — LIPASE, BLOOD: Lipase: 29 U/L (ref 11–51)

## 2018-01-14 MED ORDER — IOPAMIDOL (ISOVUE-300) INJECTION 61%
30.0000 mL | Freq: Once | INTRAVENOUS | Status: AC | PRN
  Administered 2018-01-14: 15 mL via ORAL

## 2018-01-14 MED ORDER — ONDANSETRON 4 MG PO TBDP
4.0000 mg | ORAL_TABLET | Freq: Once | ORAL | Status: AC
  Administered 2018-01-14: 4 mg via ORAL
  Filled 2018-01-14: qty 1

## 2018-01-14 MED ORDER — MORPHINE SULFATE (PF) 4 MG/ML IV SOLN
4.0000 mg | Freq: Once | INTRAVENOUS | Status: AC
  Administered 2018-01-14: 4 mg via INTRAVENOUS
  Filled 2018-01-14: qty 1

## 2018-01-14 MED ORDER — ONDANSETRON HCL 4 MG PO TABS: 4.0000 mg | ORAL_TABLET | Freq: Three times a day (TID) | ORAL | 0 refills | Status: DC | PRN

## 2018-01-14 MED ORDER — IOPAMIDOL (ISOVUE-300) INJECTION 61%
100.0000 mL | Freq: Once | INTRAVENOUS | Status: AC | PRN
  Administered 2018-01-14: 100 mL via INTRAVENOUS

## 2018-01-14 MED FILL — ONDANSETRON HCL 4 MG TABLET: 4 | 4 days supply | Qty: 12 | Fill #0

## 2018-01-14 NOTE — ED Notes (Signed)
Pt given water to drink. 

## 2018-01-14 NOTE — ED Triage Notes (Addendum)
Reports RLQ abdominal pain which began this morning with diarrhea and fever at home.  Denies dysuria, hematuria, vaginal bleeding, discharge.

## 2018-01-14 NOTE — ED Provider Notes (Signed)
[facility]PART[facility] Provider Note   CSN: [id number] Arrival date & time: 01/14/18  0957     History   Chief Complaint Chief Complaint  Patient presents with  . Abdominal Pain    HPI Karen Bradley is a 32 y.o. female presenting with RLQ pain that began this morning. She also reports diarrhea and low grade fever. She states she has never had pain like this before. She states the pain is sharp and is 10/10. It is constant. It is not radiating anywhere. She endorses nausea but has not had vomiting. She denies seeing blood in her stool. She has had her gallbladder removed and her tubes tied in the past. No sick contacts. No cough or cold symptoms.   HPI  Past Medical History:  Diagnosis Date  . Anxiety   . Depression   . GERD (gastroesophageal reflux disease)     There are no active problems to display for this patient.   Past Surgical History:  Procedure Laterality Date  . CHOLECYSTECTOMY    . TUBAL LIGATION       OB History   None      Home Medications    Prior to Admission medications   Medication Sig Start Date End Date Taking? Authorizing Provider  buPROPion (WELLBUTRIN XL) 150 MG 24 hr tablet Take 1 tablet (150 mg total) by mouth daily. 12/23/17   Saguier, Percell Miller, PA-C  clonazePAM (KLONOPIN) 0.5 MG tablet 1/2 tab po q am and 1 tab po pm 12/23/17   Saguier, Percell Miller, PA-C  ondansetron (ZOFRAN) 4 MG tablet Take 1 tablet (4 mg total) by mouth every 8 (eight) hours as needed for nausea or vomiting. 01/14/18   Steve Rattler, DO  PROAIR HFA 108 (90 Base) MCG/ACT inhaler INL 2 PFS PO Q 4 TO 6 H PRN 11/21/17   [provider]    Family History Family History  Problem Relation Age of Onset  . Depression Mother   . Diabetes Mother   . Hyperlipidemia Mother   . Heart disease Mother   . Diabetes Father   . Heart disease Father   . Hyperlipidemia Father   . Depression Brother   . Diabetes Brother   . Heart disease Brother   .  Hyperlipidemia Brother   . Mental illness Brother     Social History Social History   Tobacco Use  . Smoking status: Light Tobacco Smoker    Packs/day: 0.25  . Smokeless tobacco: Never Used  Substance Use Topics  . Alcohol use: Never    Frequency: Never  . Drug use: Yes    Types: Marijuana     Allergies   Patient has no known allergies.   Review of Systems Review of Systems  Constitutional: Positive for appetite change, diaphoresis and fever. Negative for activity change and chills.  HENT: Negative for congestion and sore throat.   Eyes: Negative for photophobia and visual disturbance.  Respiratory: Negative for cough and shortness of breath.   Cardiovascular: Negative for chest pain.  Gastrointestinal: Positive for abdominal pain, diarrhea and nausea. Negative for abdominal distention, blood in stool, constipation and vomiting.  Genitourinary: Negative for decreased urine volume and dysuria.  Musculoskeletal: Negative for myalgias.  Skin: Negative for rash.  Neurological: Negative for numbness and headaches.     Physical Exam Updated Vital Signs BP 102/62 (BP Location: Left Arm)   Pulse 63   Temp 98.2 F (36.8 C) (Oral)   Resp 18   Ht 5\' 3"  (  1.6 m)   Wt 86.2 kg   LMP 01/03/2018 (Approximate)   SpO2 100%   BMI 33.66 kg/m   Physical Exam  Constitutional: She is oriented to person, place, and time. She appears well-developed. She appears distressed.  HENT:  Head: Normocephalic and atraumatic.  Mouth/Throat: Oropharynx is clear and moist.  Eyes: Pupils are equal, round, and reactive to light. EOM are normal.  Cardiovascular: Normal rate and intact distal pulses.  Pulmonary/Chest: Effort normal. No respiratory distress.  Abdominal: Soft. Normal appearance. Bowel sounds are decreased. There is tenderness in the right lower quadrant and periumbilical area. There is rebound, guarding and tenderness at McBurney's point. No hernia.  Neurological: She is alert and  oriented to person, place, and time.  Skin: Skin is warm and dry.  Psychiatric: Her mood appears anxious.  Nursing note and vitals reviewed.    ED Treatments / Results  Labs (all labs ordered are listed, but only abnormal results are displayed) Labs Reviewed  COMPREHENSIVE METABOLIC PANEL - Abnormal; Notable for the following components:      Result Value   Glucose, Bld 111 (*)    Calcium 8.8 (*)    All other components within normal limits  CBC - Abnormal; Notable for the following components:   Hemoglobin 15.5 (*)    HCT 46.4 (*)    All other components within normal limits  LIPASE, BLOOD  URINALYSIS, ROUTINE W REFLEX MICROSCOPIC  PREGNANCY, URINE    EKG None  Radiology Ct Abdomen Pelvis W Contrast  Result Date: 01/14/2018 CLINICAL DATA:  Abdominal pain in the right lower quadrant for several hours EXAM: CT ABDOMEN AND PELVIS WITH CONTRAST TECHNIQUE: Multidetector CT imaging of the abdomen and pelvis was performed using the standard protocol following bolus administration of intravenous contrast. CONTRAST:  10mL ISOVUE-300 IOPAMIDOL (ISOVUE-300) INJECTION 61%, 117mL ISOVUE-300 IOPAMIDOL (ISOVUE-300) INJECTION 61% COMPARISON:  None. FINDINGS: Lower chest: No acute abnormality. Hepatobiliary: No focal liver abnormality is seen. Status post cholecystectomy. No biliary dilatation. Pancreas: Unremarkable. No pancreatic ductal dilatation or surrounding inflammatory changes. Spleen: Normal in size without focal abnormality. Adrenals/Urinary Tract: Adrenal glands are within normal limits. The kidneys are well visualize without renal calculi or obstructive changes. The ureters are within normal limits. The bladder is partially distended. Stomach/Bowel: The appendix is well visualized and within normal limits. No obstructive or inflammatory changes of the bowel are seen. Vascular/Lymphatic: No significant vascular findings are present. No enlarged abdominal or pelvic lymph nodes.  Reproductive: Uterus and ovaries are within normal limits Other: No abdominal wall hernia or abnormality. No abdominopelvic ascites. Musculoskeletal: Sclerosis is noted along left sacroiliac joint. No other bony abnormality is noted. IMPRESSION: Normal-appearing appendix. No renal calculi or obstructive changes. Unilateral left sacroiliac joint sclerosis. Electronically Signed   By: Inez Catalina M.D.   On: 01/14/2018 12:36    Procedures Procedures (including critical care time)  Medications Ordered in ED Medications  morphine 4 MG/ML injection 4 mg (4 mg Intravenous Given 01/14/18 1114)  iopamidol (ISOVUE-300) 61 % injection 100 mL (100 mLs Intravenous Contrast Given 01/14/18 1212)  iopamidol (ISOVUE-300) 61 % injection 30 mL (15 mLs Oral Contrast Given 01/14/18 1212)  ondansetron (ZOFRAN-ODT) disintegrating tablet 4 mg (4 mg Oral Given 01/14/18 1254)     Initial Impression / Assessment and Plan / ED Course  I have reviewed the triage vital signs and the nursing notes.  Pertinent labs & imaging results that were available during my care of the patient were reviewed by me and considered  in my medical decision making (see chart for details).  Clinical Course as of Jan 14 1318  Wed Jan 14, 2018  1156 CT Abdomen Pelvis W Contrast [AR]    Clinical Course User Index [AR] Steve Rattler, DO    32 year old female with acute RLQ abdominal pain. Afebrile, negative upreg, no leukocytosis. CMP and lipase normal. Given patients severe discomfort and pain upon palpation of abdomen in RLQ will get CT abdomen/pelvis to r/o appendicitis. IV morphine given.   Pain improved with morphine. CT scan negative for appendicitis, kidney stone, ovarian etiology. Discussed with patient. Will give zofran and PO challenge prior to discharge.  Patient feels well after zofran and tolerated water without issue. Discussed supportive care for possible diarrheal illness including staying hydrated, tylenol or ibuprofen  as needed, BRAT diet, heating pad for pain relief. Reasons to return reviewed including failure to stay hydrated, worsening pain. Patient verbalized understanding and agreement with plan.   Final Clinical Impressions(s) / ED Diagnoses   Final diagnoses:  Diarrhea, unspecified type  Right lower quadrant abdominal pain    ED Discharge Orders         Ordered    ondansetron (ZOFRAN) 4 MG tablet  Every 8 hours PRN     01/14/18 [place], DO PGY-3, [facility] 01/14/2018 1:19 PM    Steve Rattler, DO 01/14/18 1319    Little, Wenda Overland, MD 01/18/18 1331

## 2018-01-14 NOTE — Discharge Instructions (Signed)
°  Please keep yourself hydrated and follow up with PCP if symptoms persist. If you are unable to drink liquids please return to be seen. Take tylenol or ibuprofen as needed for pain. Additionally you can use a heating pad for pain relief.

## 2018-01-15 ENCOUNTER — Ambulatory Visit: Payer: Managed Care, Other (non HMO) | Admitting: Internal Medicine

## 2018-02-11 ENCOUNTER — Ambulatory Visit: Payer: Managed Care, Other (non HMO) | Admitting: Internal Medicine

## 2018-02-11 DIAGNOSIS — Z0289 Encounter for other administrative examinations: Secondary | ICD-10-CM

## 2018-03-13 ENCOUNTER — Ambulatory Visit: Payer: Managed Care, Other (non HMO) | Admitting: Family Medicine

## 2018-03-13 ENCOUNTER — Encounter: Payer: Self-pay | Admitting: Family Medicine

## 2018-03-13 ENCOUNTER — Ambulatory Visit: Payer: Self-pay | Admitting: *Deleted

## 2018-03-13 VITALS — BP 120/78 | HR 122 | Temp 98.6°F | Ht 60.0 in | Wt 204.0 lb

## 2018-03-13 DIAGNOSIS — G5623 Lesion of ulnar nerve, bilateral upper limbs: Secondary | ICD-10-CM | POA: Diagnosis not present

## 2018-03-13 DIAGNOSIS — F418 Other specified anxiety disorders: Secondary | ICD-10-CM | POA: Diagnosis not present

## 2018-03-13 MED ORDER — VENLAFAXINE HCL ER 37.5 MG PO CP24: 37.5000 mg | ORAL_CAPSULE | Freq: Every day | ORAL | 2 refills | Status: DC

## 2018-03-13 NOTE — Patient Instructions (Addendum)
Keep appointment on 3/19.   Try not to lean on elbows.  I do not think you need any imaging, surgical consultation or labs at this time.  Let us know if you need anything.  Coping skills Choose 5 that work for you:  Take a deep breath  Count to 20  Read a book  Do a puzzle  Meditate  Bake  Sing  Knit  Garden  Pray  Go outside  Call a friend  Listen to music  Take a walk  Color  Send a note  Take a bath  Watch a movie  Be alone in a quiet place  Pet an animal  Visit a friend  Journal  Exercise  Stretch

## 2018-03-13 NOTE — Telephone Encounter (Signed)
Patient is calling to report she has had on/off tingling in her left arm for about a week now- she states it starts at her elbow and goes to her fingers. Patient reports some headaches- but otherwise no other symptoms. Appointment to evaluate tingling scheduled.  Reason for Disposition . [1] Tingling (e.g., pins and needles) of the face, arm / hand, or leg / foot on one side of the body AND [2] present now  Answer Assessment - Initial Assessment Questions 1. SYMPTOM: "What is the main symptom you are concerned about?" (e.g., weakness, numbness)     Left arm- patient is feeling tingling from elbow to finger tips 2. ONSET: "When did this start?" (minutes, hours, days; while sleeping)     1 week ago 3. LAST NORMAL: "When was the last time you were normal (no symptoms)?"     Over 1 week  4. PATTERN "Does this come and go, or has it been constant since it started?"  "Is it present now?"     Comes and goes, present now 5. CARDIAC SYMPTOMS: "Have you had any of the following symptoms: chest pain, difficulty breathing, palpitations?"     no 6. NEUROLOGIC SYMPTOMS: "Have you had any of the following symptoms: headache, dizziness, vision loss, double vision, changes in speech, unsteady on your feet?"     headaches 7. OTHER SYMPTOMS: "Do you have any other symptoms?"     no 8. PREGNANCY: "Is there any chance you are pregnant?" "When was your last menstrual period?"     BTL- LMP- 03/07/18  Protocols used: NEUROLOGIC DEFICIT-A-AH

## 2018-03-13 NOTE — Progress Notes (Signed)
Chief Complaint  Patient presents with  . Arm Pain    Subjective: Patient is a 33 y.o. female here for tingling in both of her arms.  Started 1 week ago since a panic attack. Seems to correlate to her anxiety.  She is having pins/needles sensation in both of her hands consisting of all of her fingers.  She also has having medial upper extremities affected centered around the elbow.  Denies any injury or change in activity.  She is not having any weakness or other neurologic signs or symptoms.  She is not have any neck pain either.  Of note, she does lean on her elbows when she sits throughout the day.  She feels Wellbutrin is helpful for smoking and depression.  She is still having high levels of anxiety.  Marijuana is most helpful.  She has not taken the Klonopin that she does not like the way makes her feel.   ROS: Neuro: As noted in HPI Psych: +anxiety  Past Medical History:  Diagnosis Date  . Anxiety   . Depression   . GERD (gastroesophageal reflux disease)     Objective: BP 120/78 (BP Location: Left Arm, Patient Position: Sitting, Cuff Size: Normal)   Pulse (!) 122   Temp 98.6 F (37 C) (Oral)   Ht 5' (1.524 m)   Wt 204 lb (92.5 kg)   SpO2 95%   BMI 39.84 kg/m  General: Awake, appears stated age MSK: No tenderness to palpation over the cervical paraspinal musculature, 5/5 strength throughout Neuro: Sensation intact to pinprick throughout the upper extremity dermatomes; DTRs equal and symmetric in the upper extremities, no clonus or cerebellar signs, negative Tinel's at the wrist for median nerve and ulnar nerve, positive over the left ulnar nerve through the elbow Heart: RRR, no murmurs Lungs: CTAB, no rales, wheezes or rhonchi. No accessory muscle use Psych: Age appropriate judgment and insight, normal affect and mood  Assessment and Plan: Anxiety with depression - Plan: venlafaxine XR (EFFEXOR-XR) 37.5 MG 24 hr capsule  Ulnar neuropathy of both upper extremities -  Plan: venlafaxine XR (EFFEXOR-XR) 37.5 MG 24 hr capsule  Add Effexor.  This could cover for both of the above diagnoses.  I do believe she is correct and that her anxiety could be contributing to the symptoms.  I also think there is a component of ulnar neuropathy.  I think if she stops leaning on her elbows so frequently, this will improve.  We did discuss nighttime splinting with a towel and Ace wrap.  She would like to avoid this at this time.  She was also like to avoid any laboratory work at this time. Follow-up as originally scheduled with her regular PCP. The patient voiced understanding and agreement to the plan.  [place], DO 03/13/18  4:30 PM

## 2018-03-17 ENCOUNTER — Ambulatory Visit: Payer: Managed Care, Other (non HMO) | Admitting: Medical

## 2018-03-17 ENCOUNTER — Encounter: Payer: Self-pay | Admitting: Medical

## 2018-03-17 VITALS — BP 127/78 | HR 84 | Temp 98.2°F | Resp 16 | Ht 60.0 in | Wt 202.8 lb

## 2018-03-17 DIAGNOSIS — J3489 Other specified disorders of nose and nasal sinuses: Secondary | ICD-10-CM | POA: Diagnosis not present

## 2018-03-17 DIAGNOSIS — J029 Acute pharyngitis, unspecified: Secondary | ICD-10-CM | POA: Diagnosis not present

## 2018-03-17 DIAGNOSIS — K0889 Other specified disorders of teeth and supporting structures: Secondary | ICD-10-CM | POA: Diagnosis not present

## 2018-03-17 LAB — POCT RAPID STREP A (OFFICE): Rapid Strep A Screen: NEGATIVE

## 2018-03-17 MED ORDER — AMOXICILLIN-POT CLAVULANATE 875-125 MG PO TABS: 1.0000 | ORAL_TABLET | Freq: Two times a day (BID) | ORAL | 0 refills | Status: DC

## 2018-03-17 NOTE — Patient Instructions (Signed)
You presently have sinus pressure/possible sinusitis, pharyngitis and teeth pain.  Will give Augmentin antibiotic which has good coverage for strep throat, sinus infections and odontogenic infections.  We did rapid strep today.  Her medical assistant will give you the results.    Recommend low-dose ibuprofen for pain in the above areas.  Follow-up with a dentist in approximately 10 days for dental procedure.  If your above signs symptoms worsen or change please let me know.  Also in that event go ahead and schedule a follow-up with me.  Any severe symptoms change or worsening then recommend ED evaluation.

## 2018-03-17 NOTE — Progress Notes (Signed)
Subjective:    Patient ID: Karen Bradley, female    DOB: 09/23/1985, 33 y.o.   MRN: [id number]  HPI  Pt has mild moderate swelling to uvula. Mild pain on swallowing in general throat area x 2-3 days. Upper teeth need to be pulled and procedure for extraction may be in 8 days maybe little longer. Rt upper tooth back molar area has tooth that hurts. Chip on back side of that tooth. Sinus mild tender   Review of Systems  Constitutional: Negative for chills, fatigue and fever.  HENT: Positive for sinus pressure, sinus pain and sore throat.   Respiratory: Negative for cough, wheezing and stridor.   Cardiovascular: Negative for chest pain and palpitations.  Gastrointestinal: Negative for abdominal pain.  Musculoskeletal: Negative for back pain.  Skin: Negative for rash.  Neurological: Negative for dizziness, tremors, syncope, weakness, light-headedness, numbness and headaches.  Hematological: Positive for adenopathy. Does not bruise/bleed easily.  Psychiatric/Behavioral: Negative for behavioral problems and confusion.    Past Medical History:  Diagnosis Date  . Anxiety   . Depression   . GERD (gastroesophageal reflux disease)      Social History   Socioeconomic History  . Marital status: Married    Spouse name: Not on file  . Number of children: Not on file  . Years of education: Not on file  . Highest education level: Not on file  Occupational History  . Not on file  Social Needs  . Financial resource strain: Not on file  . Food insecurity:    Worry: Not on file    Inability: Not on file  . Transportation needs:    Medical: Not on file    Non-medical: Not on file  Tobacco Use  . Smoking status: Light Tobacco Smoker    Packs/day: 0.25  . Smokeless tobacco: Never Used  Substance and Sexual Activity  . Alcohol use: Never    Frequency: Never  . Drug use: Yes    Types: Marijuana  . Sexual activity: Yes  Lifestyle  . Physical activity:    Days per week: Not on file     Minutes per session: Not on file  . Stress: Not on file  Relationships  . Social connections:    Talks on phone: Not on file    Gets together: Not on file    Attends religious service: Not on file    Active member of club or organization: Not on file    Attends meetings of clubs or organizations: Not on file    Relationship status: Not on file  . Intimate partner violence:    Fear of current or ex partner: Not on file    Emotionally abused: Not on file    Physically abused: Not on file    Forced sexual activity: Not on file  Other Topics Concern  . Not on file  Social History Narrative  . Not on file    Past Surgical History:  Procedure Laterality Date  . CHOLECYSTECTOMY    . TUBAL LIGATION      Family History  Problem Relation Age of Onset  . Depression Mother   . Diabetes Mother   . Hyperlipidemia Mother   . Heart disease Mother   . Diabetes Father   . Heart disease Father   . Hyperlipidemia Father   . Depression Brother   . Diabetes Brother   . Heart disease Brother   . Hyperlipidemia Brother   . Mental illness Brother     No  Known Allergies  Current Outpatient Medications on File Prior to Visit  Medication Sig Dispense Refill  . buPROPion (WELLBUTRIN XL) 150 MG 24 hr tablet Take 1 tablet (150 mg total) by mouth daily. 30 tablet 3  . ondansetron (ZOFRAN) 4 MG tablet Take 1 tablet (4 mg total) by mouth every 8 (eight) hours as needed for nausea or vomiting. 12 tablet 0  . PROAIR HFA 108 (90 Base) MCG/ACT inhaler INL 2 PFS PO Q 4 TO 6 H PRN  0  . venlafaxine XR (EFFEXOR-XR) 37.5 MG 24 hr capsule Take 1 capsule (37.5 mg total) by mouth daily with breakfast. 30 capsule 2   No current facility-administered medications on file prior to visit.     BP 127/78   Pulse 84   Temp 98.2 F (36.8 C) (Oral)   Resp 16   Ht 5' (1.524 m)   Wt 202 lb 12.8 oz (92 kg)   SpO2 99%   BMI 39.61 kg/m       Objective:   Physical Exam  General  Mental Status -  Alert. General Appearance - Well groomed. Not in acute distress.  Skin Rashes- No Rashes.  HEENT Head- Normal. Ear Auditory Canal - Left- Normal. Right - Normal.Tympanic Membrane- Left- Normal. Right- Normal. Eye Sclera/Conjunctiva- Left- Normal. Right- Normal. Nose & Sinuses Nasal Mucosa- Left-  Boggy and Congested. Right-  Boggy and  Congested.Bilateral faint maxillary and faint  frontal sinus pressure. Mouth & Throat Lips: Upper Lip- Normal: no dryness, cracking, pallor, cyanosis, or vesicular eruption. Lower Lip-Normal: no dryness, cracking, pallor, cyanosis or vesicular eruption. Buccal Mucosa- Bilateral- No Aphthous ulcers. Oropharynx- No Discharge or Erythema. Tonsils: Characteristics- Bilateral-mild erythema.  Mild swelling of uvula.  Size/Enlargement- Bilateral- No enlargement. Discharge- bilateral-None.  Neck Neck- Supple. No Masses.   Chest and Lung Exam Auscultation: Breath Sounds:-Clear even and unlabored.  Cardiovascular Auscultation:Rythm- Regular, rate and rhythm. Murmurs & Other Heart Sounds:Ausculatation of the heart reveal- No Murmurs.  Lymphatic Head & Neck General Head & Neck Lymphatics: Bilateral: Description-faint swelling and tenderness to submandibular nodes.       Assessment & Plan:  You presently have sinus pressure/possible sinusitis, pharyngitis and teeth pain.  Will give Augmentin antibiotic which has good coverage for strep throat, sinus infections and odontogenic infections.  We did rapid strep today.  Her medical assistant will give you the results.    Recommend low-dose ibuprofen for pain in the above areas.  Follow-up with a dentist in approximately 10 days for dental procedure.  If your above signs symptoms worsen or change please let me know.  Also in that event go ahead and schedule a follow-up with me.  Any severe symptoms change or worsening then recommend ED evaluation.  Mackie Pai, PA-C

## 2018-03-30 ENCOUNTER — Ambulatory Visit: Payer: Self-pay | Admitting: Medical

## 2018-03-30 MED ORDER — FLUCONAZOLE 150 MG PO TABS: ORAL_TABLET | ORAL | 0 refills | Status: DC

## 2018-03-30 NOTE — Telephone Encounter (Signed)
On review appears likely yeast infection from antibiotic. Rx diflucan 2 tabs. One tab today. Can repeat in 3-4 days if needed

## 2018-03-30 NOTE — Telephone Encounter (Signed)
I returned her call regarding symptoms of a vaginal yeast infection due to being on Augmentin for an infection in her mouth.   Mackie Pai, PA-C saw her on 03/17/2018 and prescribed the Augmentin.  She has tried OTC creams without results.    See triage notes .  I sent a note to [employer], PA-C letting him know her symptoms.   I let the pt know someone would be in contact with her from the office.  She was agreeable to this plan.      Reason for Disposition . [1] Symptoms of a "yeast infection" (i.e., itchy, white discharge, not bad smelling) AND [2] not improved > 3 days following CARE ADVICE    She is taking Augmentin for an infection in her mough.  Answer Assessment - Initial Assessment Questions 1. DISCHARGE: "Describe the discharge." (e.g., white, yellow, green, gray, foamy, cottage cheese-like)     White, chunky discharge.    Started 3 days ago. 2. ODOR: "Is there a bad odor?"     Has a bad smell.   I'm also itching and sore. 3. ONSET: "When did the discharge begin?"     See above 4. RASH: "Is there a rash in that area?" If so, ask: "Describe it." (e.g., redness, blisters, sores, bumps)     I'm itchy and sore.  This is the first vaginal infection I've had from an antibiotics. 5. ABDOMINAL PAIN: "Are you having any abdominal pain?" If yes: "What does it feel like? " (e.g., crampy, dull, intermittent, constant)      I'm cramping due to my period.   6. ABDOMINAL PAIN SEVERITY: If present, ask: "How bad is it?"  (e.g., mild, moderate, severe)  - MILD - doesn't interfere with normal activities   - MODERATE - interferes with normal activities or awakens from sleep   - SEVERE - patient doesn't want to move (R/O peritonitis)      *No Answer* 7. CAUSE: "What do you think is causing the discharge?" "Have you had the same problem before? What happened then?"     I am still on the Augmentin for my mouth. 8. OTHER SYMPTOMS: "Do you have any other symptoms?" (e.g., fever, itching,  vaginal bleeding, pain with urination, injury to genital area, vaginal foreign body)     No fever.   No diarrhea.   I do have the burning and the itching.   I've tried the cream OTC for a day or two and it's not helping.   9. PREGNANCY: "Is there any chance you are pregnant?" "When was your last menstrual period?"     Having period.  Protocols used: VAGINAL DISCHARGE-A-AH

## 2018-03-30 NOTE — Addendum Note (Signed)
Addended by: Anabel Halon on: 03/30/2018 09:44 AM   Modules accepted: Orders

## 2018-04-23 ENCOUNTER — Ambulatory Visit: Payer: Managed Care, Other (non HMO) | Admitting: Medical

## 2018-05-31 ENCOUNTER — Other Ambulatory Visit: Payer: Self-pay | Admitting: Family Medicine

## 2018-05-31 DIAGNOSIS — G5623 Lesion of ulnar nerve, bilateral upper limbs: Secondary | ICD-10-CM

## 2018-05-31 DIAGNOSIS — F418 Other specified anxiety disorders: Secondary | ICD-10-CM

## 2018-06-01 NOTE — Telephone Encounter (Signed)
Pt is due for follow up. Left pt a message to call back and schedule vov.

## 2018-07-01 ENCOUNTER — Encounter: Payer: Self-pay | Admitting: Medical

## 2018-07-01 ENCOUNTER — Other Ambulatory Visit: Payer: Self-pay

## 2018-07-01 ENCOUNTER — Ambulatory Visit (INDEPENDENT_AMBULATORY_CARE_PROVIDER_SITE_OTHER): Payer: Managed Care, Other (non HMO) | Admitting: Medical

## 2018-07-01 ENCOUNTER — Telehealth: Payer: Self-pay

## 2018-07-01 VITALS — Temp 98.6°F

## 2018-07-01 DIAGNOSIS — M5432 Sciatica, left side: Secondary | ICD-10-CM | POA: Diagnosis not present

## 2018-07-01 MED ORDER — HYDROCODONE-ACETAMINOPHEN 5-325 MG PO TABS: 1.0000 | ORAL_TABLET | Freq: Four times a day (QID) | ORAL | 0 refills | Status: DC | PRN

## 2018-07-01 MED ORDER — DICLOFENAC SODIUM 75 MG PO TBEC: 75.0000 mg | DELAYED_RELEASE_TABLET | Freq: Two times a day (BID) | ORAL | 0 refills | Status: DC

## 2018-07-01 NOTE — Progress Notes (Addendum)
   Subjective:    Patient ID: Karen Bradley, female    DOB: [birth year], 33 y.o.   MRN: [id number]  HPI Virtual Visit via Video Note  I connected with Karen Bradley on 07/01/18 at 10:40 AM EDT by a video enabled telemedicine application and verified that I am speaking with the correct person using two identifiers.  Location: Patient: home Provider: office.  Pt does not have bp cuff at home. Recent past bp controlled.   I discussed the limitations of evaluation and management by telemedicine and the availability of in person appointments. The patient expressed understanding and agreed to proceed.  History of Present Illness:  Pt states she has some pain left mid buttox than runs down to her popliteal. She states it is sharp shooting pain from mid buttox. Pt states standing does not have pain. Sitting has more pain. Also changing position causes pain. Direct pain mid buttox area does hurt. Pain radiates at time from mid buttox to popliteal area.  Pt has been taking tylenol for pain. But not adequate.  Pt states pain present for about one week.   No left calf swelling. No thigh swelling. No knee swelling.     Observations/Objective: General-no acute distress, pleasant, oriented. Lungs- on inspection lungs appear unlabored. Neck- no tracheal deviation or jvd on inspection. Neuro- gross motor function appears intact. calfs- symmetric no swelling. No swelling of popliteal area on video  Assessment and Plan: Probable sciatic nerve pain. Will rx diclofenac, flexeril and norco. Advise use lidocaine otc patch early am and later in day when patched removed can apply warm compress.  Will see how pt does with above tx. If pain persists then will consider referral to sports med.  Follow up 2 weeks or as needed  Follow Up Instructions:    I discussed the assessment and treatment plan with the patient. The patient was provided an opportunity to ask questions and all were answered. The  patient agreed with the plan and demonstrated an understanding of the instructions.   The patient was advised to call back or seek an in-person evaluation if the symptoms worsen or if the condition fails to improve as anticipated.     Mackie Pai, PA-C   Review of Systems  Constitutional: Negative for chills, fatigue and fever.  Respiratory: Negative for cough, chest tightness, shortness of breath and wheezing.   Cardiovascular: Negative for chest pain and palpitations.  Gastrointestinal: Negative for abdominal pain.  Genitourinary: Negative for decreased urine volume, dysuria and flank pain.  Musculoskeletal: Negative for arthralgias, back pain, joint swelling, myalgias, neck pain and neck stiffness.       See hpi.  Skin: Negative for rash.  Neurological:       Radicular pain.  Hematological: Negative for adenopathy. Does not bruise/bleed easily.  Psychiatric/Behavioral: Negative for confusion and decreased concentration.       Objective:   Physical Exam        Assessment & Plan:

## 2018-07-01 NOTE — Telephone Encounter (Signed)
Recommend virtual visit.

## 2018-07-01 NOTE — Patient Instructions (Addendum)
Probable sciatic nerve pain. Will rx diclofenac, flexeril and norco. Advise use lidocaine otc patch early am and later in day when patched removed can apply warm compress.  Will see how pt does with above tx. If pain persists then will consider referral to sports med.  Follow up 2 weeks or as needed  Note did discuss with pt if has calf pain or pain increasing in popliteal area apart from buttox pain then want to get [place] of lower ext. Explained why. Pt expressed understanding.

## 2018-07-01 NOTE — Telephone Encounter (Signed)
Pt scheduled  Today

## 2018-07-01 NOTE — Telephone Encounter (Signed)
Copied from [place] [id number]. Topic: General - Other >> Jul 01, 2018  7:12 AM Keene Breath wrote: Reason for CRM: Patient called to schedule a virtual appt. Patient stated that she believes she pinched a nerve in her buttocks and it is very painful.  Please advise and call back to schedule appt.  CB# [phone]

## 2018-07-14 NOTE — Progress Notes (Signed)
Virtual Visit via Video Note  I connected with Tera Helper on 07/14/18 at  9:40 AM EDT by a video enabled telemedicine application and verified that I am speaking with the correct person using two identifiers.  Location: Patient: home Provider: office.   I discussed the limitations of evaluation and management by telemedicine and the availability of in person appointments. The patient expressed understanding and agreed to proceed.  Pt did not check her bp or pulse.  History of Present Illness: Follow up last visit for  pain left mid buttox than ran down to her popliteal. She states it is sharp shooting pain from mid buttox. Pt states standing does not have pain. Sitting has more pain. Also changing position causes pain. Direct pain mid buttox area does hurt. Pain radiates at time from mid buttox to popliteal area.  Pt has been taking tylenol for pain. But not adequate.  Pt states pain present for about one week.   No left calf swelling. No thigh swelling. No knee swelling.  Pt states pain seemed to get better with getting back into her regular exercise program. Only had to take diclofenac.    Observations/Objective: General-no acute distress, pleasant, oriented. Lungs- on inspection lungs appear unlabored. Neck- no tracheal deviation or jvd on inspection. Neuro- gross motor function appears intact. calfs- symmetric no swelling. No swelling of popliteal area on video  Assessment and Plan: Last visit thought you had  sciatic nerve pain. I rx'd diclofenac, flexeril and norco. Advised use lidocaine otc patch early am and later in day when patched removed can apply warm compress.  Will see how pt does with above tx. If pain persists then will consider referral to sports med.   Follow Up Instructions: Glad to hear symptoms resolved with exercise and brief diclofenac. Keep exercising.  Recommend flu vaccine and tdap in fall. Call for nurse visit.  Follow up within a year for  wellness exam/cpe. Recommend spring. Otherwise follow up as needed   I discussed the assessment and treatment plan with the patient. The patient was provided an opportunity to ask questions and all were answered. The patient agreed with the plan and demonstrated an understanding of the instructions.   The patient was advised to call back or seek an in-person evaluation if the symptoms worsen or if the condition fails to improve as anticipated.    Mackie Pai, PA-C

## 2018-07-14 NOTE — Patient Instructions (Addendum)
Glad to hear symptoms resolved with exercise and brief diclofenac. Keep exercising.  Recommend flu vaccine and tdap in fall. Call for nurse visit.  Follow up within a year for wellness exam/cpe. Recommend spring. Otherwise follow up as needed

## 2018-07-15 ENCOUNTER — Other Ambulatory Visit: Payer: Self-pay

## 2018-07-15 ENCOUNTER — Ambulatory Visit (INDEPENDENT_AMBULATORY_CARE_PROVIDER_SITE_OTHER): Payer: Managed Care, Other (non HMO) | Admitting: Medical

## 2018-07-15 DIAGNOSIS — M5432 Sciatica, left side: Secondary | ICD-10-CM | POA: Diagnosis not present

## 2018-07-22 ENCOUNTER — Telehealth: Payer: Self-pay

## 2018-07-22 NOTE — Telephone Encounter (Signed)
Copied from [place] [id number]. Topic: General - Other >> Jul 22, 2018 12:16 PM Keene Breath wrote: Reason for CRM: Patient called to inform the nurse or doctor that she has been having a burning in her nose and her ears have been draining.  She stated that she has no other symptoms and wanted advice as to what she should do.  Please advise and call patient back at [phone]

## 2018-07-23 NOTE — Telephone Encounter (Signed)
For this please get scheduled for virtual visit. Can get scheduled fomorrow afternoon.  Not sure what to say without visit.

## 2018-07-24 ENCOUNTER — Ambulatory Visit (INDEPENDENT_AMBULATORY_CARE_PROVIDER_SITE_OTHER): Payer: Managed Care, Other (non HMO) | Admitting: Medical

## 2018-07-24 ENCOUNTER — Other Ambulatory Visit: Payer: Self-pay

## 2018-07-24 DIAGNOSIS — J3489 Other specified disorders of nose and nasal sinuses: Secondary | ICD-10-CM | POA: Diagnosis not present

## 2018-07-24 DIAGNOSIS — H1013 Acute atopic conjunctivitis, bilateral: Secondary | ICD-10-CM | POA: Diagnosis not present

## 2018-07-24 DIAGNOSIS — J301 Allergic rhinitis due to pollen: Secondary | ICD-10-CM | POA: Diagnosis not present

## 2018-07-24 MED ORDER — LEVOCETIRIZINE DIHYDROCHLORIDE 5 MG PO TABS: 5.0000 mg | ORAL_TABLET | Freq: Every evening | ORAL | 0 refills | Status: DC

## 2018-07-24 MED ORDER — AMOXICILLIN-POT CLAVULANATE 875-125 MG PO TABS: 1.0000 | ORAL_TABLET | Freq: Two times a day (BID) | ORAL | 0 refills | Status: DC

## 2018-07-24 MED ORDER — NEOMYCIN-POLYMYXIN-HC 3.5-10000-1 OT SOLN: 3.0000 [drp] | Freq: Three times a day (TID) | OTIC | 0 refills | Status: DC

## 2018-07-24 MED ORDER — OLOPATADINE HCL 0.1 % OP SOLN: 1.0000 [drp] | Freq: Two times a day (BID) | OPHTHALMIC | 3 refills | Status: DC

## 2018-07-24 MED ORDER — PREDNISONE 10 MG PO TABS: ORAL_TABLET | ORAL | 0 refills | Status: DC

## 2018-07-24 NOTE — Telephone Encounter (Signed)
Pt scheduled  

## 2018-07-24 NOTE — Patient Instructions (Addendum)
Allergic rhinitis and allergic conjuncitivitis symptoms. Will rx xyzal, patanolol eye drops and 6 day taper prednisone.   For rt frontal sinus region pain, will rx augmentin to use in event area worsens despite above treatment.  For ear irritation, I rx'd cortisporin otic drops.  If signs and symptoms change or worsen notify us. Currently overall presentation not suspicious for covid but if symptoms change or worsen reconsider.  Follow up 7-10 days or as needed

## 2018-07-24 NOTE — Progress Notes (Signed)
Subjective:    Patient ID: Karen Bradley, female    DOB: 02/13/1985, 33 y.o.   MRN: [id number]  HPI  Virtual Visit via Video Note  I connected with Karen Bradley on 07/24/18 at  2:00 PM EDT by a video enabled telemedicine application and verified that I am speaking with the correct person using two identifiers.  Location: Patient: home Provider: home   I discussed the limitations of evaluation and management by telemedicine and the availability of in person appointments. The patient expressed understanding and agreed to proceed.  History of Present Illness:   Pt states her ears have been bothering her for paste wee. She states recent allergies are bothering her. Feels like also has burning in her nose and rt side frontal sinus pressure. Feel like chlorine water in nose sensation. But no swimming to causethis.  Pt has been sneezing and eye have been watery and itchy.   Pt states has no teeth pain. She has front partial for 4 months after tooth extraction. No fever, no chills or sweats.   Objective General-no acute distress, pleasant, oriented. Lungs- on inspection lungs appear unlabored. Neck- no tracheal deviation or jvd on inspection. Neuro- gross motor function appears intact. heent-rt frontal sinus pressure.  Assessment and Plan:  Allergic rhinitis and conjuncitivitis symptoms. Will rx xyzal, patanolol eye drops and 6 day taper prednisone.   For rt frontal sinus region pain, will rx augmentin to use in event area worsens despite above treatment.  For ear irritation, I rx'd cortisporin otic drops.  If signs and symptoms change or worsen notify us. Currently overall presentation not suspicious for covid but if symptoms change or worsen reconsider.  Follow up 7-10 days or as needed  Mackie Pai, PA-C    Follow Up Instructions:    I discussed the assessment and treatment plan with the patient. The patient was provided an opportunity to ask questions and all  were answered. The patient agreed with the plan and demonstrated an understanding of the instructions.   The patient was advised to call back or seek an in-person evaluation if the symptoms worsen or if the condition fails to improve as anticipated.  I provided 15  minutes of non-face-to-face time during this encounter.   Mackie Pai, PA-C      Review of Systems  Constitutional: Negative for chills, fatigue and fever.  HENT: Positive for congestion, ear pain, postnasal drip, sinus pressure and sinus pain. Negative for sore throat.   Eyes: Positive for itching. Negative for photophobia and redness.  Respiratory: Negative for cough, chest tightness and wheezing.   Cardiovascular: Negative for chest pain and palpitations.  Gastrointestinal: Negative for abdominal pain.  Genitourinary: Negative for dysuria.  Musculoskeletal: Negative for back pain.  Skin: Negative for rash.  Neurological: Negative for dizziness, facial asymmetry and light-headedness.  Psychiatric/Behavioral: Negative for behavioral problems and confusion.    Past Medical History:  Diagnosis Date  . Anxiety   . Depression   . GERD (gastroesophageal reflux disease)      Social History   Socioeconomic History  . Marital status: Married    Spouse name: Not on file  . Number of children: Not on file  . Years of education: Not on file  . Highest education level: Not on file  Occupational History  . Not on file  Social Needs  . Financial resource strain: Not on file  . Food insecurity    Worry: Not on file    Inability: Not on file  .  Transportation needs    Medical: Not on file    Non-medical: Not on file  Tobacco Use  . Smoking status: Light Tobacco Smoker    Packs/day: 0.25  . Smokeless tobacco: Never Used  Substance and Sexual Activity  . Alcohol use: Never    Frequency: Never  . Drug use: Yes    Types: Marijuana  . Sexual activity: Yes  Lifestyle  . Physical activity    Days per week: Not  on file    Minutes per session: Not on file  . Stress: Not on file  Relationships  . Social Herbalist on phone: Not on file    Gets together: Not on file    Attends religious service: Not on file    Active member of club or organization: Not on file    Attends meetings of clubs or organizations: Not on file    Relationship status: Not on file  . Intimate partner violence    Fear of current or ex partner: Not on file    Emotionally abused: Not on file    Physically abused: Not on file    Forced sexual activity: Not on file  Other Topics Concern  . Not on file  Social History Narrative  . Not on file    Past Surgical History:  Procedure Laterality Date  . CHOLECYSTECTOMY    . TUBAL LIGATION      Family History  Problem Relation Age of Onset  . Depression Mother   . Diabetes Mother   . Hyperlipidemia Mother   . Heart disease Mother   . Diabetes Father   . Heart disease Father   . Hyperlipidemia Father   . Depression Brother   . Diabetes Brother   . Heart disease Brother   . Hyperlipidemia Brother   . Mental illness Brother     No Known Allergies  Current Outpatient Medications on File Prior to Visit  Medication Sig Dispense Refill  . buPROPion (WELLBUTRIN XL) 150 MG 24 hr tablet Take 1 tablet (150 mg total) by mouth daily. 30 tablet 3  . diclofenac (VOLTAREN) 75 MG EC tablet Take 1 tablet (75 mg total) by mouth 2 (two) times daily. 20 tablet 0  . fluconazole (DIFLUCAN) 150 MG tablet 1 tab po today repeat in 3 days if needed 2 tablet 0  . HYDROcodone-acetaminophen (NORCO) 5-325 MG tablet Take 1 tablet by mouth every 6 (six) hours as needed for moderate pain. 16 tablet 0  . ondansetron (ZOFRAN) 4 MG tablet Take 1 tablet (4 mg total) by mouth every 8 (eight) hours as needed for nausea or vomiting. 12 tablet 0  . PROAIR HFA 108 (90 Base) MCG/ACT inhaler INL 2 PFS PO Q 4 TO 6 H PRN  0  . venlafaxine XR (EFFEXOR-XR) 37.5 MG 24 hr capsule Take 1 capsule (37.5 mg  total) by mouth daily with breakfast. 30 capsule 2   No current facility-administered medications on file prior to visit.     There were no vitals taken for this visit.      Objective:   Physical Exam        Assessment & Plan:

## 2018-08-18 ENCOUNTER — Other Ambulatory Visit: Payer: Self-pay | Admitting: Medical

## 2018-09-16 ENCOUNTER — Other Ambulatory Visit: Payer: Self-pay

## 2018-09-16 ENCOUNTER — Ambulatory Visit (INDEPENDENT_AMBULATORY_CARE_PROVIDER_SITE_OTHER): Payer: Managed Care, Other (non HMO) | Admitting: Medical

## 2018-09-16 ENCOUNTER — Encounter: Payer: Self-pay | Admitting: Medical

## 2018-09-16 DIAGNOSIS — F418 Other specified anxiety disorders: Secondary | ICD-10-CM

## 2018-09-16 DIAGNOSIS — G5623 Lesion of ulnar nerve, bilateral upper limbs: Secondary | ICD-10-CM | POA: Diagnosis not present

## 2018-09-16 MED ORDER — FLUCONAZOLE 150 MG PO TABS: 150.0000 mg | ORAL_TABLET | Freq: Once | ORAL | 0 refills | Status: AC

## 2018-09-16 MED ORDER — VENLAFAXINE HCL ER 37.5 MG PO CP24: 37.5000 mg | ORAL_CAPSULE | Freq: Every day | ORAL | 2 refills | Status: DC

## 2018-09-16 NOTE — Progress Notes (Signed)
Subjective:    Patient ID: Karen Bradley, female    DOB: May 14, 1985, 33 y.o.   MRN: [id number]  HPI Virtual Visit via Video Note  I connected with Karen Bradley on 09/16/18 at  1:20 PM EDT by a video enabled telemedicine application and verified that I am speaking with the correct person using two identifiers.  Location: Patient: home Provider: office   I discussed the limitations of evaluation and management by telemedicine and the availability of in person appointments. The patient expressed understanding and agreed to proceed.   History of Present Illness:    Pt states she has vaginal itch with white discharge. Also some pain on urination as well. Pt state more burning and itching.  Pt states she thinks may have had uti for about. About one week hurting when urinate.  LMP- 3 weeks ago.  Pt has been doing well with effexor for anxiety. No side effects.   Observations/Objective: General- no acute distress, pleasant. Oriented. Normal speech.  Assessment and Plan: By your signs/symptom description, I do think that you likely have yeast infection.  UTI is a consideration but I think less likely.  It is better to start treatment with Diflucan 1 tablet today and assess your clinical response.  If you report no improvement at all by tomorrow then would recommend that you come by the office to give urine sample for urine culture.  He has no improvement at all and urine culture needed that might need to give you low dose antibiotic pending urine culture/urine ancillary results.  Follow-up date to be determined depending on clinical response to treatment.  Mackie Pai, PA-C    Follow Up Instructions:    I discussed the assessment and treatment plan with the patient. The patient was provided an opportunity to ask questions and all were answered. The patient agreed with the plan and demonstrated an understanding of the instructions.   The patient was advised to call back or  seek an in-person evaluation if the symptoms worsen or if the condition fails to improve as anticipated.  I provided 76minutes of non-face-to-face time during this encounter.   Mackie Pai, PA-C    Review of Systems  Constitutional: Negative for chills, fatigue and fever.  Respiratory: Negative for chest tightness, shortness of breath and wheezing.   Cardiovascular: Negative for chest pain and palpitations.  Genitourinary: Positive for dysuria and vaginal discharge. Negative for difficulty urinating, dyspareunia, frequency and vaginal pain.       Vaginal itching, burning and thick white discharge.  No recent antibiotics reported.  Skin: Negative for rash.  Hematological: Negative for adenopathy. Does not bruise/bleed easily.  Psychiatric/Behavioral: Negative for behavioral problems and decreased concentration.   Past Medical History:  Diagnosis Date  . Anxiety   . Depression   . GERD (gastroesophageal reflux disease)      Social History   Socioeconomic History  . Marital status: Married    Spouse name: Not on file  . Number of children: Not on file  . Years of education: Not on file  . Highest education level: Not on file  Occupational History  . Not on file  Social Needs  . Financial resource strain: Not on file  . Food insecurity    Worry: Not on file    Inability: Not on file  . Transportation needs    Medical: Not on file    Non-medical: Not on file  Tobacco Use  . Smoking status: Light Tobacco Smoker    Packs/day:  0.25  . Smokeless tobacco: Never Used  Substance and Sexual Activity  . Alcohol use: Never    Frequency: Never  . Drug use: Yes    Types: Marijuana  . Sexual activity: Yes  Lifestyle  . Physical activity    Days per week: Not on file    Minutes per session: Not on file  . Stress: Not on file  Relationships  . Social Herbalist on phone: Not on file    Gets together: Not on file    Attends religious service: Not on file     Active member of club or organization: Not on file    Attends meetings of clubs or organizations: Not on file    Relationship status: Not on file  . Intimate partner violence    Fear of current or ex partner: Not on file    Emotionally abused: Not on file    Physically abused: Not on file    Forced sexual activity: Not on file  Other Topics Concern  . Not on file  Social History Narrative  . Not on file    Past Surgical History:  Procedure Laterality Date  . CHOLECYSTECTOMY    . TUBAL LIGATION      Family History  Problem Relation Age of Onset  . Depression Mother   . Diabetes Mother   . Hyperlipidemia Mother   . Heart disease Mother   . Diabetes Father   . Heart disease Father   . Hyperlipidemia Father   . Depression Brother   . Diabetes Brother   . Heart disease Brother   . Hyperlipidemia Brother   . Mental illness Brother     No Known Allergies  Current Outpatient Medications on File Prior to Visit  Medication Sig Dispense Refill  . buPROPion (WELLBUTRIN XL) 150 MG 24 hr tablet Take 1 tablet (150 mg total) by mouth daily. 30 tablet 3  . diclofenac (VOLTAREN) 75 MG EC tablet Take 1 tablet (75 mg total) by mouth 2 (two) times daily. 20 tablet 0  . HYDROcodone-acetaminophen (NORCO) 5-325 MG tablet Take 1 tablet by mouth every 6 (six) hours as needed for moderate pain. 16 tablet 0  . levocetirizine (XYZAL) 5 MG tablet TAKE 1 TABLET(5 MG) BY MOUTH EVERY EVENING 30 tablet 0  . neomycin-polymyxin-hydrocortisone (CORTISPORIN) OTIC solution Place 3 drops into both ears 3 (three) times daily. 10 mL 0  . olopatadine (PATANOL) 0.1 % ophthalmic solution Place 1 drop into both eyes 2 (two) times daily. 5 mL 3  . ondansetron (ZOFRAN) 4 MG tablet Take 1 tablet (4 mg total) by mouth every 8 (eight) hours as needed for nausea or vomiting. 12 tablet 0  . predniSONE (DELTASONE) 10 MG tablet Taper over 6 days 21 tablet 0  . PROAIR HFA 108 (90 Base) MCG/ACT inhaler INL 2 PFS PO Q 4 TO 6  H PRN  0   No current facility-administered medications on file prior to visit.     BP 120/81   Temp 98.7 F (37.1 C)       Objective:   Physical Exam        Assessment & Plan:

## 2018-09-16 NOTE — Patient Instructions (Addendum)
By your signs/symptom description, I do think that you likely have yeast infection.  UTI is a consideration but I think less likely.  It is better to start treatment with Diflucan 1 tablet today and assess your clinical response.  If you report no improvement at all by tomorrow then would recommend that you come by the office to give urine sample for urine culture.  He has no improvement at all and urine culture needed that might need to give you low dose antibiotic pending urine culture/urine ancillary results.  Follow-up date to be determined depending on clinical response to treatment.

## 2018-09-17 ENCOUNTER — Other Ambulatory Visit: Payer: Self-pay | Admitting: Medical

## 2018-09-17 ENCOUNTER — Telehealth: Payer: Self-pay

## 2018-09-17 ENCOUNTER — Encounter: Payer: Self-pay | Admitting: Medical

## 2018-09-17 DIAGNOSIS — F418 Other specified anxiety disorders: Secondary | ICD-10-CM

## 2018-09-17 DIAGNOSIS — G5623 Lesion of ulnar nerve, bilateral upper limbs: Secondary | ICD-10-CM

## 2018-09-17 MED ORDER — VENLAFAXINE HCL ER 37.5 MG PO CP24: 37.5000 mg | ORAL_CAPSULE | Freq: Every day | ORAL | 2 refills | Status: DC

## 2018-09-17 NOTE — Telephone Encounter (Signed)
Copied from [place] [id number]. Topic: General - Other >> Sep 16, 2018  5:34 PM Yvette Rack wrote: Reason for CRM: John with [facility] stated they received Rx for venlafaxine XR (EFFEXOR-XR) 37.5 MG 24 hr capsule but pt insurance will only approve a 90 day supply. John asked if Rx can be resent for 90 day supply.

## 2018-09-17 NOTE — Telephone Encounter (Signed)
90 day rx effexor sent to pt pharmacy.

## 2018-10-26 ENCOUNTER — Other Ambulatory Visit: Payer: Self-pay | Admitting: *Deleted

## 2018-10-26 MED ORDER — LEVOCETIRIZINE DIHYDROCHLORIDE 5 MG PO TABS: ORAL_TABLET | ORAL | 0 refills | Status: DC

## 2019-03-09 ENCOUNTER — Other Ambulatory Visit: Payer: Self-pay | Admitting: Medical

## 2019-05-18 IMAGING — CT CT ANGIO CHEST
3 of 9 series · 18 of 36 positions shown · IV contrast (iopamidol)
Comparison: 11/28/2017 chest radiograph

CLINICAL DATA: 32 y/o  F; 1 day of shortness of breath.

EXAM:
CT ANGIOGRAPHY CHEST WITH CONTRAST
TECHNIQUE: Multidetector CT imaging of the chest was performed using the
standard protocol during bolus administration of intravenous
contrast. Multiplanar CT image reconstructions and MIPs were
obtained to evaluate the vascular anatomy.
CONTRAST:  72mL V2ZTGU-FSH IOPAMIDOL (V2ZTGU-FSH) INJECTION 76%

[Series 9: pe lung · axial · 0.98mm/px · z∈[-203,-116]mm · 2 of 87 slices shown]
[im 29/87  mediastinal]
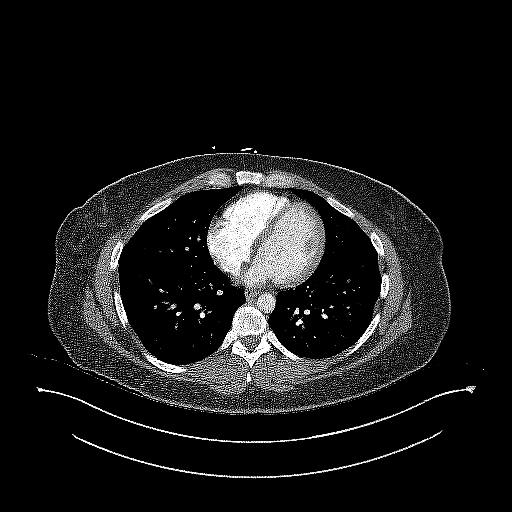
[im 58/87  mediastinal]
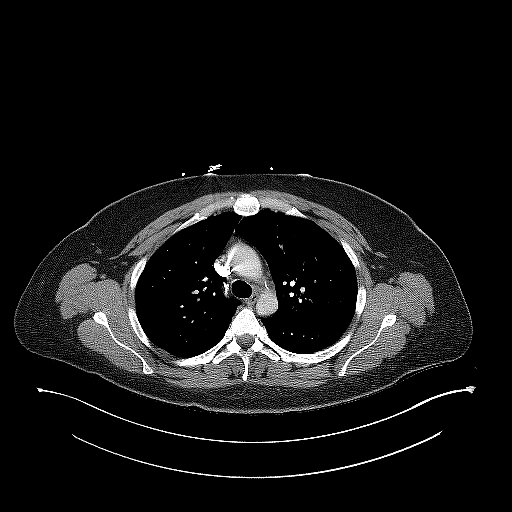

[Series 10: pe thins · axial · 0.98mm/px · z∈[-311,-48]mm · 15 of 301 slices shown]
[im 19/301  lung]
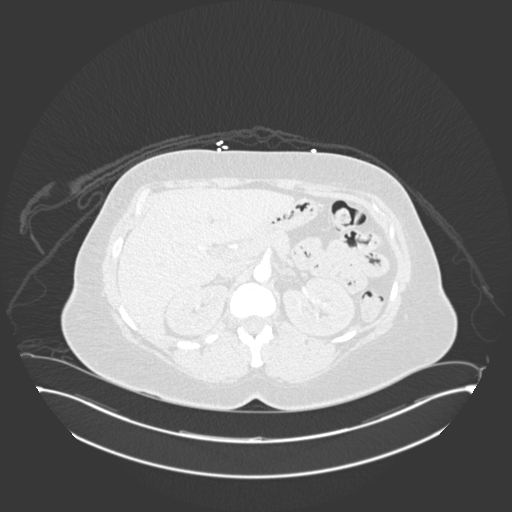
[im 38/301  mediastinal]
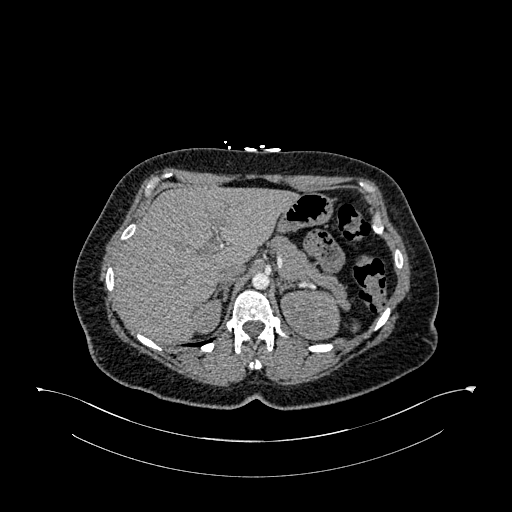
[im 57/301  lung]
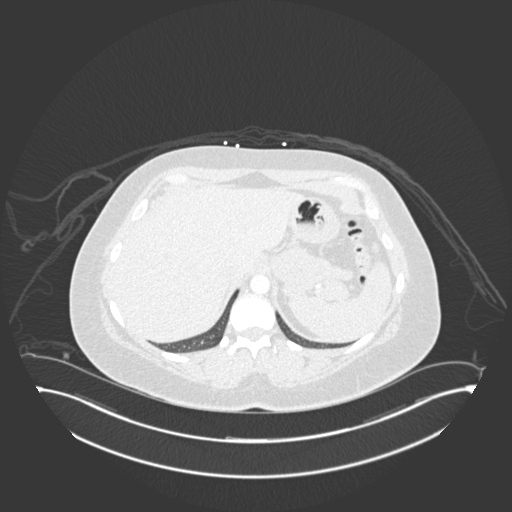
[im 76/301  mediastinal]
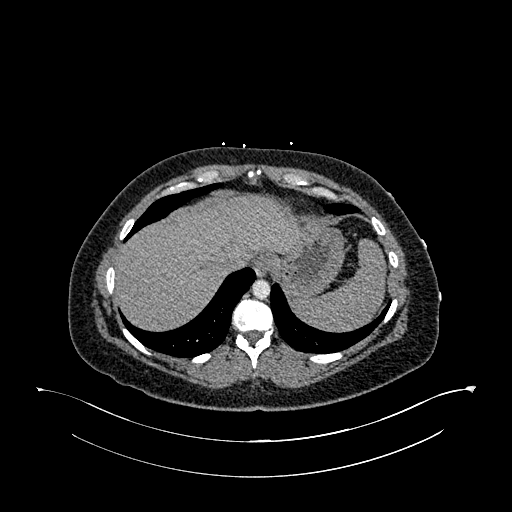
[im 94/301  lung]
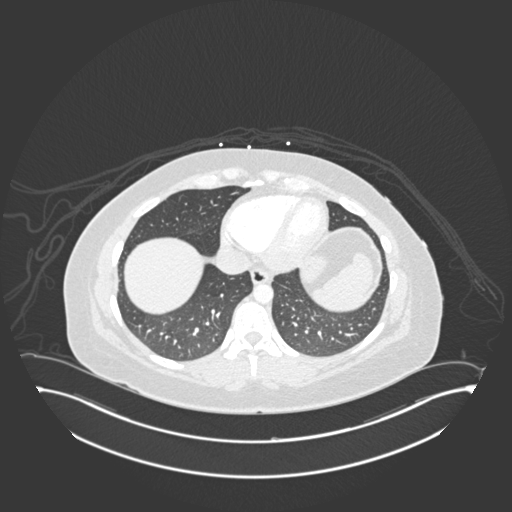
[im 113/301  mediastinal]
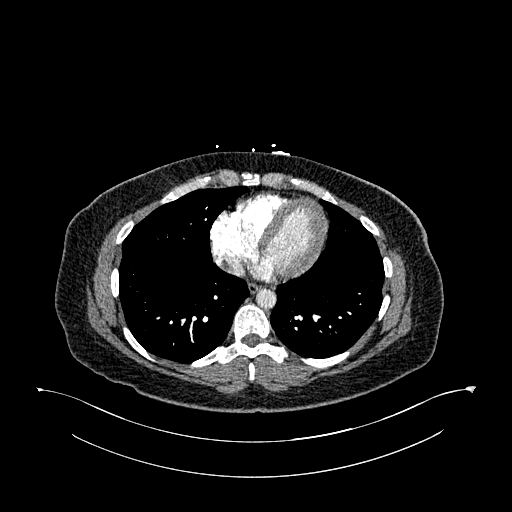
[im 132/301  lung]
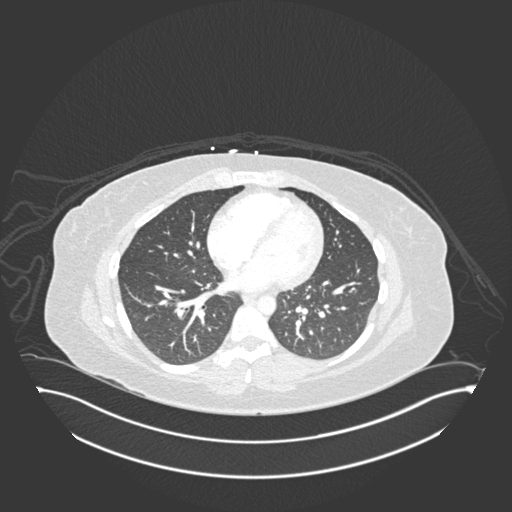
[im 151/301  mediastinal]
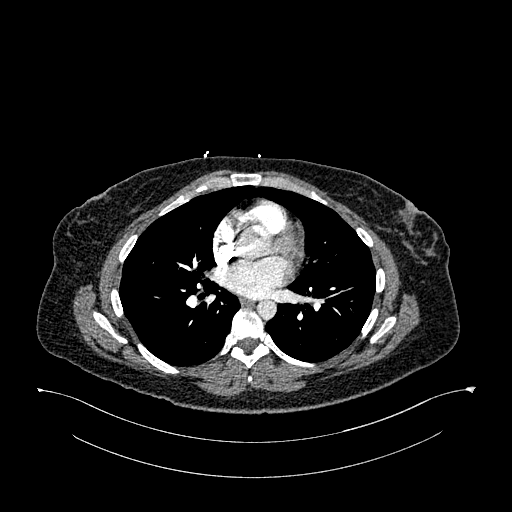
[im 169/301  lung]
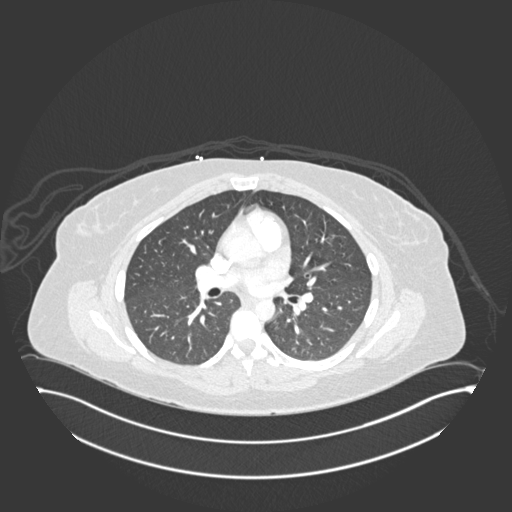
[im 188/301  mediastinal]
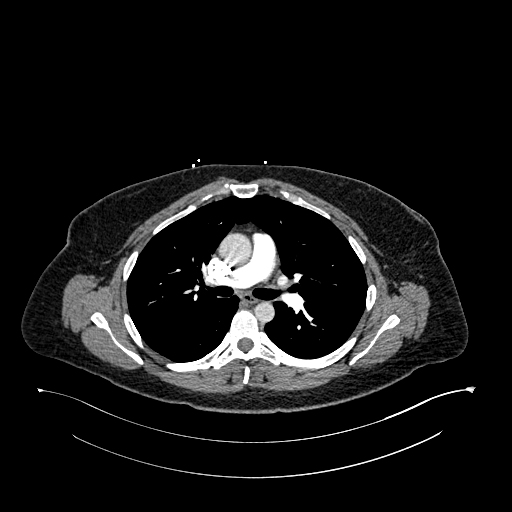
[im 207/301  lung]
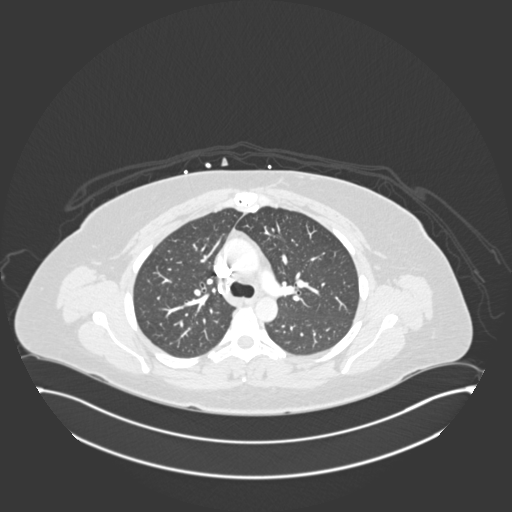
[im 226/301  mediastinal]
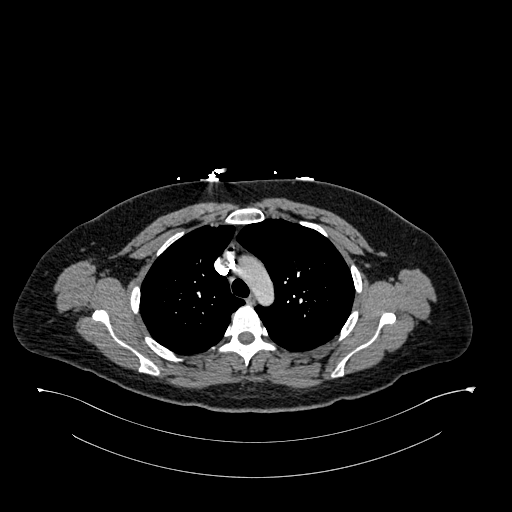
[im 244/301  lung]
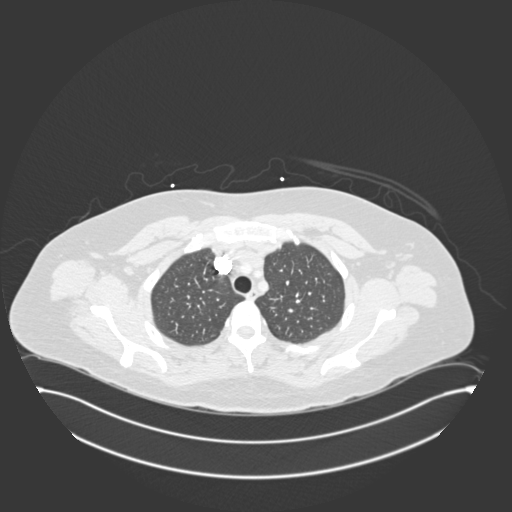
[im 263/301  mediastinal]
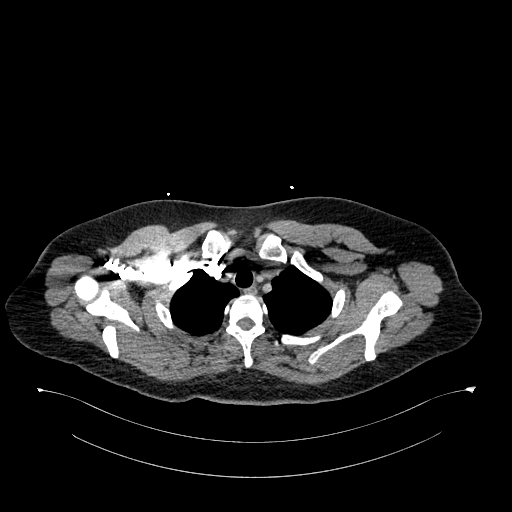
[im 282/301  lung]
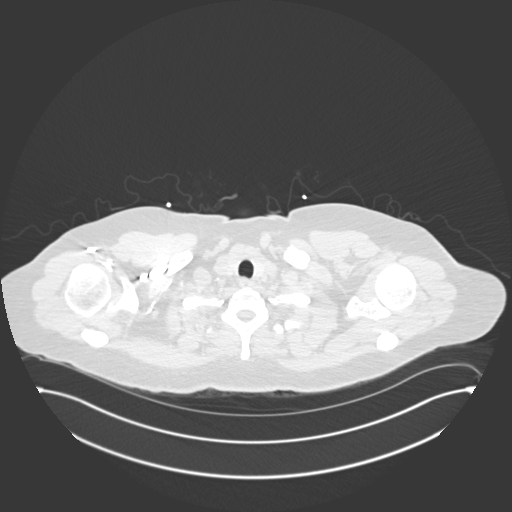

[Series 11: pe coronal mpr · coronal · 0.63mm/px · 1 of 133 slices shown]
[im 67/133  mediastinal]
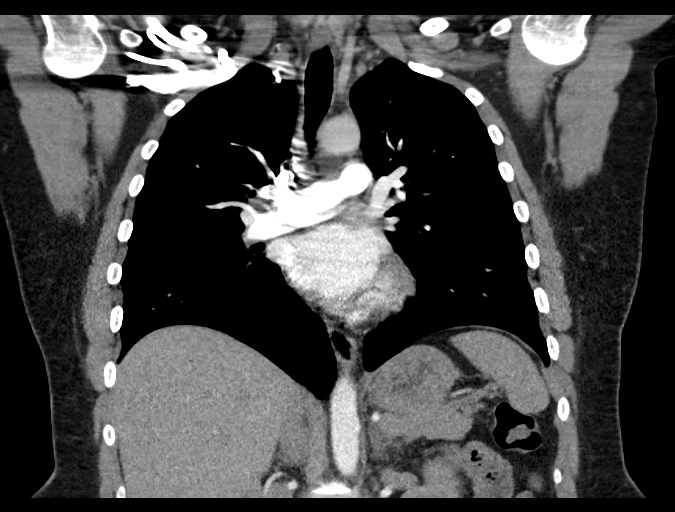

[18 of 36 positions shown; findings below may reference images not displayed]

FINDINGS: Cardiovascular: Satisfactory opacification of the pulmonary arteries
to the segmental level. No evidence of pulmonary embolism. Normal
heart size. No pericardial effusion.

Mediastinum/Nodes: No enlarged mediastinal, hilar, or axillary lymph
nodes. Thyroid gland, trachea, and esophagus demonstrate no
significant findings.

Lungs/Pleura: Lungs are clear. No pleural effusion or pneumothorax.

Upper Abdomen: Cholecystectomy. 15 mm right adrenal nodule measuring
3 HU compatible with adenoma.

Musculoskeletal: No chest wall abnormality. No acute or significant
osseous findings.

Review of the MIP images confirms the above findings.
IMPRESSION: 1. No pulmonary embolus identified.  Clear lungs.
2. 15 mm right adrenal adenoma.

By: Agyaku Riis M.D.

## 2019-09-17 ENCOUNTER — Other Ambulatory Visit: Payer: Self-pay | Admitting: Medical

## 2019-09-17 DIAGNOSIS — F418 Other specified anxiety disorders: Secondary | ICD-10-CM

## 2019-09-17 DIAGNOSIS — G5623 Lesion of ulnar nerve, bilateral upper limbs: Secondary | ICD-10-CM

## 2020-04-04 ENCOUNTER — Telehealth: Payer: Self-pay | Admitting: Medical

## 2020-04-04 DIAGNOSIS — F418 Other specified anxiety disorders: Secondary | ICD-10-CM

## 2020-04-04 DIAGNOSIS — G5623 Lesion of ulnar nerve, bilateral upper limbs: Secondary | ICD-10-CM

## 2020-04-04 MED ORDER — VENLAFAXINE HCL ER 37.5 MG PO CP24: 37.5000 mg | ORAL_CAPSULE | Freq: Every day | ORAL | 2 refills | Status: DC

## 2020-04-04 NOTE — Telephone Encounter (Signed)
Medication:  venlafaxine XR (EFFEXOR-XR) 37.5 MG 24 hr capsule [[id number]]      Has the patient contacted their pharmacy?  (If no, request that the patient contact the pharmacy for the refill.) (If yes, when and what did the pharmacy advise?)     Preferred Pharmacy (with phone number or street name): [facility] [address], [address]  [place], [place] [address]  Phone:  [phone] Fax:  [phone]      Agent: Please be advised that RX refills may take up to 3 business days. We ask that you follow-up with your pharmacy.

## 2020-04-04 NOTE — Telephone Encounter (Signed)
Rx sent 

## 2020-10-11 ENCOUNTER — Other Ambulatory Visit: Payer: Self-pay | Admitting: Medical

## 2020-10-11 DIAGNOSIS — G5623 Lesion of ulnar nerve, bilateral upper limbs: Secondary | ICD-10-CM

## 2020-10-11 DIAGNOSIS — F418 Other specified anxiety disorders: Secondary | ICD-10-CM

## 2020-11-07 ENCOUNTER — Other Ambulatory Visit: Payer: Self-pay | Admitting: Medical

## 2020-11-07 DIAGNOSIS — G5623 Lesion of ulnar nerve, bilateral upper limbs: Secondary | ICD-10-CM

## 2020-11-07 DIAGNOSIS — F418 Other specified anxiety disorders: Secondary | ICD-10-CM

## 2021-07-16 ENCOUNTER — Other Ambulatory Visit: Payer: Self-pay | Admitting: Medical

## 2021-07-16 DIAGNOSIS — F418 Other specified anxiety disorders: Secondary | ICD-10-CM

## 2021-07-16 DIAGNOSIS — G5623 Lesion of ulnar nerve, bilateral upper limbs: Secondary | ICD-10-CM

## 2021-07-18 ENCOUNTER — Ambulatory Visit: Payer: Managed Care, Other (non HMO) | Admitting: Medical

## 2021-09-06 ENCOUNTER — Telehealth: Payer: Self-pay | Admitting: Medical

## 2021-09-06 NOTE — Telephone Encounter (Signed)
Pt is not sure if she is due for a visit in regard to the following medication. Pt stated that if she is, she is currently not in a position to make an appt as she with a new job and is not able to take time off yet:  Medication:   venlafaxine XR (EFFEXOR-XR) 37.5 MG 24 hr capsule [[id number]]  Has the patient contacted their pharmacy? No. (If no, request that the patient contact the pharmacy for the refill.) (If yes, when and what did the pharmacy advise?)  Preferred Pharmacy (with phone number or street name):   [id number] [place] [address] [place] [address] Phone:  [phone] Fax:  [phone]  Agent: Please be advised that RX refills may take up to 3 business days. We ask that you follow-up with your pharmacy.

## 2021-09-06 NOTE — Telephone Encounter (Signed)
Please get patient scheduled for an virtual visit if she is unable to come into the office

## 2021-09-10 ENCOUNTER — Telehealth (INDEPENDENT_AMBULATORY_CARE_PROVIDER_SITE_OTHER): Payer: Managed Care, Other (non HMO) | Admitting: Medical

## 2021-09-10 ENCOUNTER — Encounter: Payer: Self-pay | Admitting: Medical

## 2021-09-10 VITALS — Ht 60.0 in | Wt 190.0 lb

## 2021-09-10 DIAGNOSIS — F418 Other specified anxiety disorders: Secondary | ICD-10-CM | POA: Diagnosis not present

## 2021-09-10 MED ORDER — VENLAFAXINE HCL ER 75 MG PO CP24: ORAL_CAPSULE | ORAL | 0 refills | Status: DC

## 2021-09-10 NOTE — Patient Instructions (Signed)
Anxiety and depression.  On review and discussion.  More anxious.  Off of medication for 2 months and on review has not had an office visit for about 3 years.  Most recently had doubled up on Effexor 37.5 and noted mood and anxiety was much better.  Prescribing Effexor 75 XR dose to take 1 point daily.   Follow up in office in one month or sooner if needed.

## 2021-09-10 NOTE — Progress Notes (Signed)
Subjective:    Patient ID: Karen Bradley, female    DOB: 19-Jan-1986, 37 y.o.   MRN: [id number]  HPI  Virtual Visit via Video Note  I connected with Karen Bradley on 09/10/21 at  2:20 PM EDT by a video enabled telemedicine application and verified that I am speaking with the correct person using two identifiers.  Location: Patient: home Provider: office   I discussed the limitations of evaluation and management by telemedicine and the availability of in person appointments. The patient expressed understanding and agreed to proceed.  History of Present Illness:  Re-establish after 3 years. Last tme I saw was in 09-2018.   Pt working from home. Works for [employer].  Pt states walks 1-2 mile a day. Moderate healthy diet.  2 cups of coffee a day.Rare alcohol. 1/4 pack a day.    She has history of anxiety and depression. I had prescribed back in 2020.   Pt states she had been out of meds for 2 months. She was taking 2 of 37. 5 mg daily and she states she noticed helped.   Without meds snappy, anxious and not motivated to do anything.   She states without meds she random bodyaches when anxiety is high. Muscles get tight in her body.   Lmp- jluy 31, 2023.  No vitals given today.   Describing more anxiety than depression.    Observations/Objective: General-no acute distress, pleasant, oriented. Lungs- on inspection lungs appear unlabored. Neck- no tracheal deviation or jvd on inspection. Neuro- gross motor function appears intact.   Assessment and Plan:   Follow Up Instructions:    I discussed the assessment and treatment plan with the patient. The patient was provided an opportunity to ask questions and all were answered. The patient agreed with the plan and demonstrated an understanding of the instructions.   The patient was advised to call back or seek an in-person evaluation if the symptoms worsen or if the condition fails to improve as  anticipated.     Mackie Pai, PA-C   Review of Systems  Constitutional:  Negative for chills, fatigue and fever.  Respiratory:  Negative for cough, chest tightness and wheezing.   Cardiovascular:  Negative for chest pain and palpitations.  Gastrointestinal:  Negative for abdominal pain.  Genitourinary:  Negative for dysuria.  Musculoskeletal:  Negative for back pain.  Skin:  Negative for rash.  Neurological:  Negative for dizziness, light-headedness and headaches.  Hematological:  Negative for adenopathy. Does not bruise/bleed easily.  Psychiatric/Behavioral:  Positive for agitation and dysphoric mood. Negative for confusion, sleep disturbance and suicidal ideas. The patient is nervous/anxious.     Past Medical History:  Diagnosis Date   Anxiety    Depression    GERD (gastroesophageal reflux disease)      Social History   Socioeconomic History   Marital status: Married    Spouse name: Not on file   Number of children: Not on file   Years of education: Not on file   Highest education level: Not on file  Occupational History   Not on file  Tobacco Use   Smoking status: Light Smoker    Packs/day: 0.25    Types: Cigarettes   Smokeless tobacco: Never  Substance and Sexual Activity   Alcohol use: Never   Drug use: Yes    Types: Marijuana   Sexual activity: Yes  Other Topics Concern   Not on file  Social History Narrative   Not on file   Social Determinants  of Health   Financial Resource Strain: Not on file  Food Insecurity: Not on file  Transportation Needs: Not on file  Physical Activity: Not on file  Stress: Not on file  Social Connections: Not on file  Intimate Partner Violence: Not on file    Past Surgical History:  Procedure Laterality Date   CHOLECYSTECTOMY     TUBAL LIGATION      Family History  Problem Relation Age of Onset   Depression Mother    Diabetes Mother    Hyperlipidemia Mother    Heart disease Mother    Diabetes Father     Heart disease Father    Hyperlipidemia Father    Depression Brother    Diabetes Brother    Heart disease Brother    Hyperlipidemia Brother    Mental illness Brother     No Known Allergies  Current Outpatient Medications on File Prior to Visit  Medication Sig Dispense Refill   Cetirizine HCl (ZYRTEC ALLERGY PO) Take by mouth.     No current facility-administered medications on file prior to visit.    Ht 5' (1.524 m)   Wt 190 lb (86.2 kg) Comment: pt reports  LMP 09/03/2021   BMI 37.11 kg/m        Objective:   Physical Exam        Assessment & Plan:

## 2021-10-03 ENCOUNTER — Other Ambulatory Visit: Payer: Self-pay | Admitting: Medical

## 2022-03-26 ENCOUNTER — Other Ambulatory Visit: Payer: Self-pay | Admitting: Medical

## 2022-08-16 ENCOUNTER — Telehealth (INDEPENDENT_AMBULATORY_CARE_PROVIDER_SITE_OTHER): Payer: Managed Care, Other (non HMO) | Admitting: Medical

## 2022-08-16 DIAGNOSIS — F418 Other specified anxiety disorders: Secondary | ICD-10-CM

## 2022-08-16 MED ORDER — BUSPIRONE HCL 7.5 MG PO TABS: 7.5000 mg | ORAL_TABLET | Freq: Two times a day (BID) | ORAL | 0 refills | Status: DC

## 2022-08-16 NOTE — Progress Notes (Signed)
Subjective:    Patient ID: Karen Bradley, female    DOB: May 23, 1985, 37 y.o.   MRN: [id number]  HPI Virtual Visit via Video Note  I connected with Karen Bradley on 08/16/22 at  8:20 AM EDT by a video enabled telemedicine application and verified that I am speaking with the correct person using two identifiers.  Location: Patient: home Provider: office   I discussed the limitations of evaluation and management by telemedicine and the availability of in person appointments. The patient expressed understanding and agreed to proceed.  History of Present Illness: Pt in for follow up.  Pt phq-9 score is 15. Gad-7 is 14. No thoughts of harm to self or others.   Pt has been anxious and very stressed/depressed. 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 Pt brother passed one year ago. Her dad who she does not have good relationship with has recently tried to commit suicide twice. She went to visit her dad week of 24th.   Pt has been missing work June 24 th- 28 th. Also missed work yesterday and today as well.   Pt missed week of 24th when dad attempted suicide. Pt states she can't concentrate during those high stress periods.  She has baseline anxiety and depression with dad attempt suicide and brother passing symptoms have worsened.  Lmp- 3 weeks ago.      Observations/Objective:  General-no acute distress, pleasant, oriented. Lungs- on inspection lungs appear unlabored. Neck- no tracheal deviation or jvd on inspection. Neuro- gross motor function appears intact.   Assessment and Plan: Patient Instructions  Anxiety and depression high level recently with work-related stress as well as dealing with passing out her brother 1 year ago and her dad's recent suicide attempts.  Struggling to work in light of these recent events in the family.  Patient has intermittently struggled with depression for years.  I have not seen her  recently.  Patient is on Effexor and advised her to continue that daily.  Adding on BuSpar to help more with anxiety.  I will place referral to behavioral health.  Patient is going to call mood treatment center today to start the process.  Wrote note excusing her for recent days on this.  Follow-up in 2 weeks and asking her to bring in FMLA form so I can fill out during office visit/after reassessment.   Esperanza Richters, PA-C   Follow Up Instructions:    I discussed the assessment and treatment plan with the patient. The patient was provided an opportunity to ask questions and all were answered. The patient agreed with the plan and demonstrated an understanding of the instructions.   The patient was advised to call back or seek an in-person evaluation if the symptoms worsen or if the condition fails to improve as anticipated.     Esperanza Richters, PA-C    Review of Systems  Constitutional:  Negative for chills, fatigue and fever.  Respiratory:  Negative for cough, chest tightness, shortness of breath and wheezing.   Cardiovascular:  Negative for chest pain and palpitations.  Gastrointestinal:  Negative for abdominal pain, diarrhea and nausea.  Genitourinary:  Negative for dyspareunia, dysuria, flank pain and frequency.  Musculoskeletal:  Negative for back pain and joint swelling.  Neurological:  Negative for facial asymmetry, light-headedness and numbness.  Psychiatric/Behavioral:  Positive for dysphoric mood. Negative for suicidal ideas. The patient is nervous/anxious.    Past Medical History:  Diagnosis Date   Anxiety  Depression    GERD (gastroesophageal reflux disease)      Social History   Socioeconomic History   Marital status: Married    Spouse name: Not on file   Number of children: Not on file   Years of education: Not on file   Highest education level: Not on file  Occupational History   Not on file  Tobacco Use   Smoking status: Light Smoker    Current  packs/day: 0.25    Types: Cigarettes   Smokeless tobacco: Never  Substance and Sexual Activity   Alcohol use: Never   Drug use: Yes    Types: Marijuana   Sexual activity: Yes  Other Topics Concern   Not on file  Social History Narrative   Not on file   Social Determinants of Health   Financial Resource Strain: Not on file  Food Insecurity: Not on file  Transportation Needs: Not on file  Physical Activity: Not on file  Stress: Not on file  Social Connections: Not on file  Intimate Partner Violence: Not on file    Past Surgical History:  Procedure Laterality Date   CHOLECYSTECTOMY     TUBAL LIGATION      Family History  Problem Relation Age of Onset   Depression Mother    Diabetes Mother    Hyperlipidemia Mother    Heart disease Mother    Diabetes Father    Heart disease Father    Hyperlipidemia Father    Depression Brother    Diabetes Brother    Heart disease Brother    Hyperlipidemia Brother    Mental illness Brother     No Known Allergies  Current Outpatient Medications on File Prior to Visit  Medication Sig Dispense Refill   Cetirizine HCl (ZYRTEC ALLERGY PO) Take by mouth.     venlafaxine XR (EFFEXOR-XR) 75 MG 24 hr capsule TAKE 1 CAPSULE BY MOUTH EVERY DAY 90 capsule 1   No current facility-administered medications on file prior to visit.    There were no vitals taken for this visit.       Objective:   Physical Exam        Assessment & Plan:

## 2022-08-16 NOTE — Patient Instructions (Addendum)
Anxiety and depression high level recently with work-related stress as well as dealing with passing out her brother 1 year ago and her dad's recent suicide attempts.  Struggling to work in light of these recent events in the family.  Patient has intermittently struggled with depression for years.  I have not seen her recently.  Patient is on Effexor and advised her to continue that daily.  Adding on BuSpar to help more with anxiety.  I will place referral to behavioral health.  Patient is going to call mood treatment center today to start the process.  Wrote note excusing her for recent days on this.  Follow-up in 2 weeks and asking her to bring in FMLA form so I can fill out during office visit/after reassessment.

## 2022-08-20 ENCOUNTER — Ambulatory Visit: Payer: Managed Care, Other (non HMO) | Admitting: Medical

## 2022-08-20 VITALS — BP 120/72 | HR 95 | Resp 158 | Ht 60.0 in | Wt 241.0 lb

## 2022-08-20 DIAGNOSIS — F418 Other specified anxiety disorders: Secondary | ICD-10-CM | POA: Diagnosis not present

## 2022-08-20 NOTE — Patient Instructions (Addendum)
Anxiety and depression- still present but moderate now compared to before. Recommend continue effexor and buspar. As you return to work let me know after one week how your are doing. We could increase dose of med.  Also please call mood treatment center for appointment.  Filled out fmla form today. Ask you provide prior letter to HR/flma personal as well.  Follow up in August 6 th or sooner if needed.

## 2022-08-20 NOTE — Progress Notes (Signed)
Subjective:    Patient ID: Karen Bradley, female    DOB: Oct 03, 1985, 37 y.o.   MRN: [id number]  HPI  Pt in for follow up from recent video visit.  Hpi from last exam.    Review of Systems  Constitutional:  Negative for chills, fatigue and fever.  Respiratory:  Negative for cough, chest tightness, shortness of breath and wheezing.   Cardiovascular:  Negative for chest pain and palpitations.  Gastrointestinal:  Negative for abdominal pain, nausea and rectal pain.  Musculoskeletal:  Positive for back pain.  Neurological:  Negative for dizziness, speech difficulty, weakness, numbness and headaches.  Hematological:  Negative for adenopathy. Does not bruise/bleed easily.  Psychiatric/Behavioral:  Positive for behavioral problems and dysphoric mood. Negative for suicidal ideas. The patient is nervous/anxious.    Pt phq-9 score is 15. Gad-7 is 14. No thoughts of harm to self or others.     Pt has been anxious and very stressed/depressed. 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 Pt brother passed one year ago. Her dad who she does not have good relationship with has recently tried to commit suicide twice. She went to visit her dad week of 24th.    Pt has been missing work June 24 th- 28 th. Also missed work yesterday and today as well.    Pt missed week of 24th when dad attempted suicide. Pt states she can't concentrate during those high stress periods.   She has baseline anxiety and depression with dad attempt suicide and brother passing symptoms have worsened.  A/P  Expand All Collapse All    Subjective:       Pt in for follow up.    " Pt phq-9 score is 15. Gad-7 is 14. No thoughts of harm to self or others.     Pt has been anxious and very stressed/depressed. 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 Pt brother passed one year ago. Her dad who she does  not have good relationship with has recently tried to commit suicide twice. She went to visit her dad week of 24th.    Pt has been missing work June 24 th- 28 th. Also missed work yesterday and today as well.    Pt missed week of 24th when dad attempted suicide. Pt states she can't concentrate during those high stress periods.   She has baseline anxiety and depression with dad attempt suicide and brother passing symptoms have worsened.   Lmp- 3 weeks ago.     A/P  Anxiety and depression high level recently with work-related stress as well as dealing with passing out her brother 1 year ago and her dad's recent suicide attempts.  Struggling to work in light of these recent events in the family.  Patient has intermittently struggled with depression for years.  I have not seen her recently.   Patient is on Effexor and advised her to continue that daily.  Adding on BuSpar to help more with anxiety.   I will place referral to behavioral health.  Patient is going to call mood treatment center today to start the process.   Wrote note excusing her for recent days on this.   Follow-up in 2 weeks and asking her to bring in FMLA form so I can fill out during office visit/after reassessment."   Today Pt states she is bring the paperwork today since work told her she needs to fill  out form. Pt has just got back from [place] visiting her dad. He is being indefinitively held in the hospital since he is still unstable and trying to commit suicide.  Pt thinks buspar and effexor has helped some. Though she expresses she thinks no med would completely control anxiety in light of recent circumstances.   She has not yet called the mood treatment center.          Pt states she is bring the paperwork today since work told her she needs to fill out form. Pt has just got back from [place] visiting her dad. He is being indefinitively held in the hospital since he is still unstable and trying to commit  suicide.  Pt thinks buspar and effexor has helped some. Though she expresses she thinks no med would completely control anxiety in light of recent circumstances.   She has not yet called the mood treatment center.        Objective:   Physical Exam  General Mental Status- Alert. General Appearance- Not in acute distress.   Skin General: Color- Normal Color. Moisture- Normal Moisture.  Neck Carotid Arteries- Normal color. Moisture- Normal Moisture. No carotid bruits. No JVD.  Chest and Lung Exam Auscultation: Breath Sounds:-Normal.  Cardiovascular Auscultation:Rythm- Regular. Murmurs & Other Heart Sounds:Auscultation of the heart reveals- No Murmurs.  Abdomen Inspection:-Inspeection Normal. Palpation/Percussion:Note:No mass. Palpation and Percussion of the abdomen reveal- Non Tender, Non Distended + BS, no rebound or guarding.  Neurologic Cranial Nerve exam:- CN III-XII intact(No nystagmus), symmetric smile. Strength:- 5/5 equal and symmetric strength both upper and lower extremities.       Assessment & Plan:   Patient Instructions  Anxiety and depression- still present but moderate now compared to before. Recommend continue effexor and buspar. As you return to work let me know after one week how your are doing. We could increase dose of med.  Also please call mood treatment center for appointment.  Filled out fmla form today. Ask you provide prior letter to HR/flma personal as well.  Follow up in August 6 th or sooner if needed.   Esperanza Richters, PA-C

## 2022-09-10 ENCOUNTER — Ambulatory Visit: Payer: Managed Care, Other (non HMO) | Admitting: Medical

## 2022-09-10 VITALS — BP 118/70 | HR 91 | Resp 18 | Ht 63.0 in | Wt 249.0 lb

## 2022-09-10 DIAGNOSIS — F418 Other specified anxiety disorders: Secondary | ICD-10-CM

## 2022-09-10 MED ORDER — VENLAFAXINE HCL ER 150 MG PO CP24: 150.0000 mg | ORAL_CAPSULE | Freq: Every day | ORAL | 0 refills | Status: DC

## 2022-09-10 NOTE — Patient Instructions (Signed)
Depression and Anxiety PHQ-9 score of 11 and GAD-7 score of 19, indicating worsening of symptoms(but paper work and stress from potential loss). Currently on Effexor 75mg  and BuSpar 7.5mg  twice daily. Patient reports benefit from the combination of Effexor and BuSpar,  but is experiencing increased stress due to [facility] paperwork and job security concerns. -Increase Effexor to 150mg  daily. -Reevaluate in 30 days.  FMLA and Work Stress Patient is experiencing significant stress related to [employer] paperwork and job security. This is contributing to worsening depression and anxiety scores. -Complete FMLA paperwork to allow for up to 10 consecutive days off as needed, or sporadic days off for therapy and counseling appointments. -This FMLA allowance will apply until February 05, 2023.  Behavioral Health Patient is planning to attend behavioral health sessions once FMLA paperwork is approved. -Encourage patient to schedule and attend these sessions as soon as possible.   Follow up in one month or sooner if needed.

## 2022-09-10 NOTE — Progress Notes (Signed)
Subjective:    Patient ID: Karen Bradley, female    DOB: 1986-01-22, 37 y.o.   MRN: [id number]  HPI Discussed the use of AI scribe software for clinical note transcription with the patient, who gave verbal consent to proceed.  History of Present Illness   The patient, currently managed on Effexor 75 mg and BuSpar 7.5 mg twice daily for depression and anxiety, presents for a follow-up visit. She reports an increase in her PHQ-9 score from 9 to 11 and GAD-7 score from 14 to 19 since the last visit. Despite this, she expresses satisfaction with the combination of Effexor and BuSpar, noting an improvement in her symptoms.  However, she attributes the recent worsening of her scores to stress related to job security and [employer] paperwork. She expresses a need for more flexibility in leave, particularly for consecutive days, to accommodate therapy and counseling for PTSD.  The patient suggests an increase in the Effexor dosage might be beneficial, as she appreciates the additive effect of BuSpar during the day when the slow-release capsule of Effexor is not as effective. She also notes that her last menstrual cycle occurred a week and a half ago, lasting for three days.         Review of Systems  Constitutional:  Negative for chills, fatigue and fever.  Respiratory:  Negative for cough, chest tightness and wheezing.   Cardiovascular:  Negative for chest pain and palpitations.  Gastrointestinal:  Negative for abdominal pain.  Neurological:  Negative for dizziness, speech difficulty and headaches.  Hematological:  Negative for adenopathy. Does not bruise/bleed easily.  Psychiatric/Behavioral:  Positive for dysphoric mood. Negative for suicidal ideas. The patient is nervous/anxious.     Past Medical History:  Diagnosis Date   Anxiety    Depression    GERD (gastroesophageal reflux disease)      Social History   Socioeconomic History   Marital status: Married    Spouse name: Not on file    Number of children: Not on file   Years of education: Not on file   Highest education level: Some college, no degree  Occupational History   Not on file  Tobacco Use   Smoking status: Light Smoker    Current packs/day: 0.25    Types: Cigarettes   Smokeless tobacco: Never  Substance and Sexual Activity   Alcohol use: Never   Drug use: Yes    Types: Marijuana   Sexual activity: Yes  Other Topics Concern   Not on file  Social History Narrative   Not on file   Social Determinants of Health   Financial Resource Strain: Medium Risk (08/19/2022)   Overall Financial Resource Strain (CARDIA)    Difficulty of Paying Living Expenses: Somewhat hard  Food Insecurity: No Food Insecurity (08/19/2022)   Hunger Vital Sign    Worried About Running Out of Food in the Last Year: Never true    Ran Out of Food in the Last Year: Never true  Transportation Needs: Unmet Transportation Needs (08/19/2022)   PRAPARE - Administrator, Civil Service (Medical): Yes    Lack of Transportation (Non-Medical): No  Physical Activity: Insufficiently Active (08/19/2022)   Exercise Vital Sign    Days of Exercise per Week: 3 days    Minutes of Exercise per Session: 40 min  Stress: Stress Concern Present (08/19/2022)   [employer] of Occupational Health - Occupational Stress Questionnaire    Feeling of Stress : Very much  Social Connections: Socially Isolated (08/19/2022)  Social Advertising account executive [NHANES]    Frequency of Communication with Friends and Family: Never    Frequency of Social Gatherings with Friends and Family: Once a week    Attends Religious Services: Never    Database administrator or Organizations: No    Attends Engineer, structural: Not on file    Marital Status: Married  Catering manager Violence: Not on file    Past Surgical History:  Procedure Laterality Date   CHOLECYSTECTOMY     TUBAL LIGATION      Family History  Problem Relation Age of Onset    Depression Mother    Diabetes Mother    Hyperlipidemia Mother    Heart disease Mother    Diabetes Father    Heart disease Father    Hyperlipidemia Father    Depression Brother    Diabetes Brother    Heart disease Brother    Hyperlipidemia Brother    Mental illness Brother     No Known Allergies  Current Outpatient Medications on File Prior to Visit  Medication Sig Dispense Refill   busPIRone (BUSPAR) 7.5 MG tablet Take 1 tablet (7.5 mg total) by mouth 2 (two) times daily. 60 tablet 0   Cetirizine HCl (ZYRTEC ALLERGY PO) Take by mouth.     No current facility-administered medications on file prior to visit.    BP 118/70   Pulse 91   Resp 18   Ht 5\' 3"  (1.6 m)   Wt 249 lb (112.9 kg)   SpO2 97%   BMI 44.11 kg/m        Objective:   Physical Exam  General Mental Status- Alert. General Appearance- Not in acute distress.   Skin General: Color- Normal Color. Moisture- Normal Moisture.  Neck No thyromegaly.  Chest and Lung Exam Auscultation: Breath Sounds:-Normal.  Cardiovascular Auscultation:Rythm- Regular. Murmurs & Other Heart Sounds:Auscultation of the heart reveals- No Murmurs.    Neurologic Cranial Nerve exam:- CN III-XII intact(No nystagmus), symmetric smile. Strength:- 5/5 equal and symmetric strength both upper and lower extremities.       Assessment & Plan:  Assessment and Plan    Depression and Anxiety PHQ-9 score of 11 and GAD-7 score of 19, indicating worsening of symptoms(but paper work and stress from potential loss). Currently on Effexor 75mg  and BuSpar 7.5mg  twice daily. Patient reports benefit from the combination of Effexor and BuSpar,  but is experiencing increased stress due to [facility] paperwork and job security concerns. -Increase Effexor to 150mg  daily. -Reevaluate in 30 days.  FMLA and Work Stress Patient is experiencing significant stress related to [employer] paperwork and job security. This is contributing to worsening depression and  anxiety scores. -Complete FMLA paperwork to allow for up to 10 consecutive days off as needed, or sporadic days off for therapy and counseling appointments. -This FMLA allowance will apply until February 05, 2023.  Behavioral Health Patient is planning to attend behavioral health sessions once FMLA paperwork is approved. -Encourage patient to schedule and attend these sessions as soon as possible.   Follow up in one month or sooner if needed.       Esperanza Richters, PA-C    Time spent with patient today was  40 minutes which consisted of chart review, discussing diagnosis, treatment, filling out fmla paperwork on office visit and documentation.

## 2022-09-11 ENCOUNTER — Other Ambulatory Visit: Payer: Self-pay | Admitting: Medical

## 2022-09-13 ENCOUNTER — Telehealth: Payer: Self-pay | Admitting: Medical

## 2022-09-13 MED ORDER — BUSPIRONE HCL 7.5 MG PO TABS: 7.5000 mg | ORAL_TABLET | Freq: Two times a day (BID) | ORAL | 0 refills | Status: DC

## 2022-09-13 MED ORDER — VENLAFAXINE HCL ER 150 MG PO CP24: 150.0000 mg | ORAL_CAPSULE | Freq: Every day | ORAL | 0 refills | Status: DC

## 2022-09-13 NOTE — Telephone Encounter (Signed)
Pt called to advise that medication sent to wrong pharmacy. Please remove [facility] in [place] from her record, insurance will not cover that pharmacy. Pt would like to have her two prescriptions sent to [facility] on [address].    Venlafaxine xr 150 mg and Buspirone 7.5 mg

## 2022-09-13 NOTE — Telephone Encounter (Signed)
Rx sent 

## 2022-09-13 NOTE — Addendum Note (Signed)
Addended by: Maximino Sarin on: 09/13/2022 02:32 PM   Modules accepted: Orders

## 2022-09-19 ENCOUNTER — Encounter (INDEPENDENT_AMBULATORY_CARE_PROVIDER_SITE_OTHER): Payer: Self-pay

## 2022-10-05 ENCOUNTER — Other Ambulatory Visit: Payer: Self-pay | Admitting: Medical

## 2022-10-14 ENCOUNTER — Ambulatory Visit: Payer: Managed Care, Other (non HMO) | Admitting: Medical

## 2023-03-02 ENCOUNTER — Telehealth: Payer: Self-pay | Admitting: Medical

## 2023-03-02 NOTE — Telephone Encounter (Signed)
Pt last seen in August. Filled out fmla paperwork back then. Asked pt to follow up in one month. Looks like she did not keep requested follow up. Now have new form to fill out. I don't know how she is. She needs appointment for me to assess and decide how to fill out paperwork.

## 2023-03-03 NOTE — Telephone Encounter (Signed)
Opened to review

## 2023-03-05 ENCOUNTER — Telehealth: Payer: Self-pay

## 2023-03-05 NOTE — Telephone Encounter (Signed)
Follow up appt letter

## 2023-03-05 NOTE — Telephone Encounter (Signed)
Letter sent.

## 2023-03-27 ENCOUNTER — Telehealth: Payer: Self-pay

## 2023-03-27 NOTE — Telephone Encounter (Signed)
Going thru paperwork and noticed this pt never made appointment to fill out [employer] form. I had sent you note about this. I think you reached out to pt informing her to follow up. Do not know current status of patient. Therefore impossible for me to fill out.

## 2023-03-27 NOTE — Telephone Encounter (Signed)
Paperwork faxed into front office. Placed on HG desk with charge sheet on top for review

## 2023-03-27 NOTE — Telephone Encounter (Signed)
Called and spoke with patient.  Patient said we can shred the FMLA. Patient has already gotten these forms handled and does not need [place] to do anything with them!

## 2023-04-26 ENCOUNTER — Other Ambulatory Visit: Payer: Self-pay | Admitting: Medical

## 2023-07-10 ENCOUNTER — Telehealth: Admitting: Family Medicine

## 2023-07-10 DIAGNOSIS — Z91199 Patient's noncompliance with other medical treatment and regimen due to unspecified reason: Secondary | ICD-10-CM

## 2023-07-10 NOTE — Progress Notes (Signed)
 Arrived early but never returned. The patient no-showed for appointment despite this provider sending direct link, reaching out via phone with no response and waiting for at least 10 minutes from appointment time for patient to join. They will be marked as a NS for this appointment/time.   Lanetta Pion, NP

## 2023-11-16 ENCOUNTER — Other Ambulatory Visit: Payer: Self-pay | Admitting: Medical

## 2023-12-15 ENCOUNTER — Other Ambulatory Visit: Payer: Self-pay | Admitting: Medical

## 2024-01-06 ENCOUNTER — Other Ambulatory Visit: Payer: Self-pay | Admitting: Medical

## 2024-01-06 NOTE — Telephone Encounter (Signed)
 Called pt and got her scheduled for 01/09/24 30 day refill sent

## 2024-01-06 NOTE — Telephone Encounter (Signed)
 Last OV 09/2022- needs appt

## 2024-01-09 ENCOUNTER — Ambulatory Visit: Admitting: Medical

## 2024-01-30 ENCOUNTER — Other Ambulatory Visit: Payer: Self-pay | Admitting: Medical

## 2024-01-30 NOTE — Telephone Encounter (Signed)
 Pt has not been seen since 2024 and no showed her apt on 01/09/2024

## 2024-02-03 ENCOUNTER — Other Ambulatory Visit: Payer: Self-pay | Admitting: Medical
